# Patient Record
Sex: Male | Born: 1965 | Hispanic: Yes | Marital: Married | Smoking: Former smoker
Health system: Southern US, Community
[De-identification: ages and names within clinical notes are randomized; demographics above are authoritative.]

## PROBLEM LIST (undated history)

## (undated) DIAGNOSIS — I10 Essential (primary) hypertension: Secondary | ICD-10-CM

## (undated) DIAGNOSIS — E119 Type 2 diabetes mellitus without complications: Secondary | ICD-10-CM

---

## 2021-08-22 ENCOUNTER — Emergency Department: Payer: Self-pay

## 2021-08-22 ENCOUNTER — Other Ambulatory Visit: Payer: Self-pay

## 2021-08-22 ENCOUNTER — Inpatient Hospital Stay
Admission: EM | Admit: 2021-08-22 | Discharge: 2021-08-26 | DRG: 641 | Disposition: A | Discharge status: Home / Self Care | Payer: Self-pay | Attending: Internal Medicine | Admitting: Internal Medicine

## 2021-08-22 ENCOUNTER — Encounter: Payer: Self-pay | Admitting: Emergency Medicine

## 2021-08-22 DIAGNOSIS — M5126 Other intervertebral disc displacement, lumbar region: Secondary | ICD-10-CM | POA: Diagnosis present

## 2021-08-22 DIAGNOSIS — Z87891 Personal history of nicotine dependence: Secondary | ICD-10-CM

## 2021-08-22 DIAGNOSIS — M545 Low back pain, unspecified: Secondary | ICD-10-CM

## 2021-08-22 DIAGNOSIS — E119 Type 2 diabetes mellitus without complications: Secondary | ICD-10-CM

## 2021-08-22 DIAGNOSIS — E039 Hypothyroidism, unspecified: Secondary | ICD-10-CM

## 2021-08-22 DIAGNOSIS — K567 Ileus, unspecified: Secondary | ICD-10-CM | POA: Diagnosis present

## 2021-08-22 DIAGNOSIS — K5900 Constipation, unspecified: Secondary | ICD-10-CM

## 2021-08-22 DIAGNOSIS — R140 Abdominal distension (gaseous): Secondary | ICD-10-CM

## 2021-08-22 DIAGNOSIS — Z20822 Contact with and (suspected) exposure to covid-19: Secondary | ICD-10-CM | POA: Diagnosis present

## 2021-08-22 DIAGNOSIS — E871 Hypo-osmolality and hyponatremia: Principal | ICD-10-CM | POA: Diagnosis present

## 2021-08-22 DIAGNOSIS — Z794 Long term (current) use of insulin: Secondary | ICD-10-CM

## 2021-08-22 DIAGNOSIS — I10 Essential (primary) hypertension: Secondary | ICD-10-CM | POA: Diagnosis present

## 2021-08-22 DIAGNOSIS — Z6824 Body mass index (BMI) 24.0-24.9, adult: Secondary | ICD-10-CM

## 2021-08-22 DIAGNOSIS — R7989 Other specified abnormal findings of blood chemistry: Secondary | ICD-10-CM

## 2021-08-22 DIAGNOSIS — E861 Hypovolemia: Secondary | ICD-10-CM | POA: Diagnosis present

## 2021-08-22 DIAGNOSIS — Z79899 Other long term (current) drug therapy: Secondary | ICD-10-CM

## 2021-08-22 DIAGNOSIS — R630 Anorexia: Secondary | ICD-10-CM | POA: Diagnosis present

## 2021-08-22 DIAGNOSIS — E875 Hyperkalemia: Secondary | ICD-10-CM

## 2021-08-22 HISTORY — DX: Essential (primary) hypertension: I10

## 2021-08-22 HISTORY — DX: Type 2 diabetes mellitus without complications: E11.9

## 2021-08-22 LAB — URINALYSIS, ROUTINE W REFLEX MICROSCOPIC
Bacteria, UA: NONE SEEN
Bilirubin Urine: NEGATIVE
Glucose, UA: NEGATIVE mg/dL
Hgb urine dipstick: NEGATIVE
Ketones, ur: NEGATIVE mg/dL
Leukocytes,Ua: NEGATIVE
Nitrite: NEGATIVE
Protein, ur: 30 mg/dL — AB
Specific Gravity, Urine: 1.011 (ref 1.005–1.030)
Squamous Epithelial / HPF: NONE SEEN (ref 0–5)
pH: 7 (ref 5.0–8.0)

## 2021-08-22 LAB — COMPREHENSIVE METABOLIC PANEL
ALT: 19 U/L (ref 0–44)
AST: 18 U/L (ref 15–41)
Albumin: 2.8 g/dL — ABNORMAL LOW (ref 3.5–5.0)
Alkaline Phosphatase: 65 U/L (ref 38–126)
Anion gap: 4 — ABNORMAL LOW (ref 5–15)
BUN: 22 mg/dL — ABNORMAL HIGH (ref 6–20)
CO2: 27 mmol/L (ref 22–32)
Calcium: 8.9 mg/dL (ref 8.9–10.3)
Chloride: 91 mmol/L — ABNORMAL LOW (ref 98–111)
Creatinine, Ser: 0.81 mg/dL (ref 0.61–1.24)
GFR, Estimated: >60 mL/min (ref >60)
Glucose, Bld: 107 mg/dL — ABNORMAL HIGH (ref 70–99)
Potassium: 5.1 mmol/L (ref 3.5–5.1)
Sodium: 122 mmol/L — ABNORMAL LOW (ref 135–145)
Total Bilirubin: 0.7 mg/dL (ref 0.3–1.2)
Total Protein: 5.9 g/dL — ABNORMAL LOW (ref 6.5–8.1)

## 2021-08-22 LAB — HIV ANTIBODY (ROUTINE TESTING W REFLEX): HIV Screen 4th Generation wRfx: NONREACTIVE

## 2021-08-22 LAB — LIPASE, BLOOD: Lipase: 33 U/L (ref 11–51)

## 2021-08-22 LAB — CBC WITH DIFFERENTIAL/PLATELET
Abs Immature Granulocytes: 0.01 10*3/uL (ref 0.00–0.07)
Basophils Absolute: 0 10*3/uL (ref 0.0–0.1)
Basophils Relative: 0 %
Eosinophils Absolute: 0.1 10*3/uL (ref 0.0–0.5)
Eosinophils Relative: 1 %
HCT: 35.2 % — ABNORMAL LOW (ref 39.0–52.0)
Hemoglobin: 13 g/dL (ref 13.0–17.0)
Immature Granulocytes: 0 %
Lymphocytes Relative: 45 %
Lymphs Abs: 2.6 10*3/uL (ref 0.7–4.0)
MCH: 30.8 pg (ref 26.0–34.0)
MCHC: 36.9 g/dL — ABNORMAL HIGH (ref 30.0–36.0)
MCV: 83.4 fL (ref 80.0–100.0)
Monocytes Absolute: 0.5 10*3/uL (ref 0.1–1.0)
Monocytes Relative: 8 %
Neutro Abs: 2.6 10*3/uL (ref 1.7–7.7)
Neutrophils Relative %: 46 %
Platelets: 368 10*3/uL (ref 150–400)
RBC: 4.22 MIL/uL (ref 4.22–5.81)
RDW: 12.5 % (ref 11.5–15.5)
WBC: 5.8 10*3/uL (ref 4.0–10.5)
nRBC: 0 % (ref 0.0–0.2)

## 2021-08-22 LAB — RESP PANEL BY RT-PCR (FLU A&B, COVID) ARPGX2
Influenza A by PCR: NEGATIVE
Influenza B by PCR: NEGATIVE
SARS Coronavirus 2 by RT PCR: NEGATIVE

## 2021-08-22 LAB — BASIC METABOLIC PANEL
Anion gap: 6 (ref 5–15)
BUN: 21 mg/dL — ABNORMAL HIGH (ref 6–20)
CO2: 24 mmol/L (ref 22–32)
Calcium: 8.8 mg/dL — ABNORMAL LOW (ref 8.9–10.3)
Chloride: 93 mmol/L — ABNORMAL LOW (ref 98–111)
Creatinine, Ser: 0.91 mg/dL (ref 0.61–1.24)
GFR, Estimated: >60 mL/min (ref >60)
Glucose, Bld: 101 mg/dL — ABNORMAL HIGH (ref 70–99)
Potassium: 4.9 mmol/L (ref 3.5–5.1)
Sodium: 123 mmol/L — ABNORMAL LOW (ref 135–145)

## 2021-08-22 LAB — GLUCOSE, CAPILLARY
Glucose-Capillary: 150 mg/dL — ABNORMAL HIGH (ref 70–99)
Glucose-Capillary: 104 mg/dL — ABNORMAL HIGH (ref 70–99)

## 2021-08-22 LAB — OSMOLALITY, URINE: Osmolality, Ur: 179 mOsm/kg — ABNORMAL LOW (ref 300–900)

## 2021-08-22 LAB — HEPATIC FUNCTION PANEL
ALT: 21 U/L (ref 0–44)
AST: 18 U/L (ref 15–41)
Albumin: 2.6 g/dL — ABNORMAL LOW (ref 3.5–5.0)
Alkaline Phosphatase: 71 U/L (ref 38–126)
Bilirubin, Direct: <0.1 mg/dL (ref 0.0–0.2)
Total Bilirubin: 0.6 mg/dL (ref 0.3–1.2)
Total Protein: 5.5 g/dL — ABNORMAL LOW (ref 6.5–8.1)

## 2021-08-22 LAB — OSMOLALITY: Osmolality: 264 mOsm/kg — ABNORMAL LOW (ref 275–295)

## 2021-08-22 LAB — CBG MONITORING, ED: Glucose-Capillary: 166 mg/dL — ABNORMAL HIGH (ref 70–99)

## 2021-08-22 LAB — SODIUM: Sodium: 133 mmol/L — ABNORMAL LOW (ref 135–145)

## 2021-08-22 LAB — TROPONIN I (HIGH SENSITIVITY)
Troponin I (High Sensitivity): 10 ng/L (ref <18)
Troponin I (High Sensitivity): 10 ng/L (ref <18)

## 2021-08-22 LAB — HEMOGLOBIN A1C
Hgb A1c MFr Bld: 7.5 % — ABNORMAL HIGH (ref 4.8–5.6)
Mean Plasma Glucose: 168.55 mg/dL

## 2021-08-22 LAB — TSH: TSH: 9.042 u[IU]/mL — ABNORMAL HIGH (ref 0.350–4.500)

## 2021-08-22 IMAGING — CT CT ABD-PELV W/ CM
2 of 5 series · 15 of 46 positions shown, 17 images · IV contrast (APPLIED)
Comparison: Chest radiographs [E1] hours today.

CLINICAL DATA: 56-year-old male with abdominal distension.
Constipation. Abdominal pain for 2 weeks.

EXAM:
CT ABDOMEN AND PELVIS WITH CONTRAST
TECHNIQUE: Multidetector CT imaging of the abdomen and pelvis was performed
using the standard protocol following bolus administration of
intravenous contrast.

[Series 2: routine abd/pel with · axial · 0.75mm/px · z∈[-504,-84]mm · 12 of 94 slices shown, 14 images]
[im 5/94  soft-tissue]
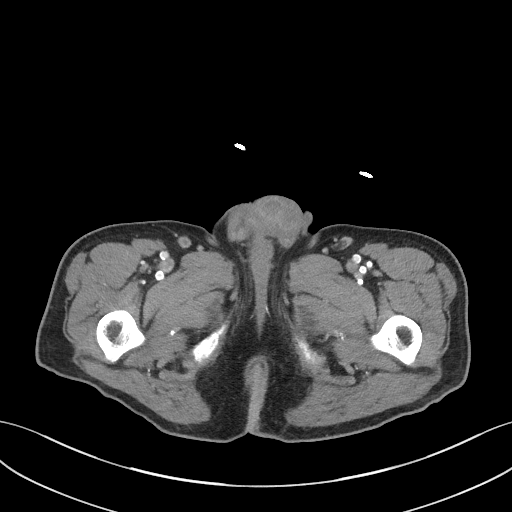
[im 5/94  bone]
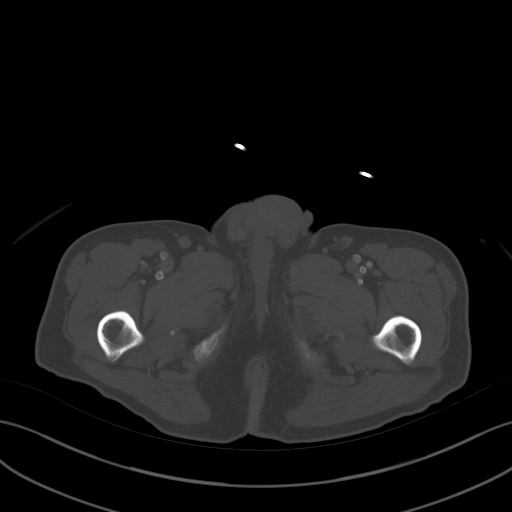
[im 15/94  soft-tissue]
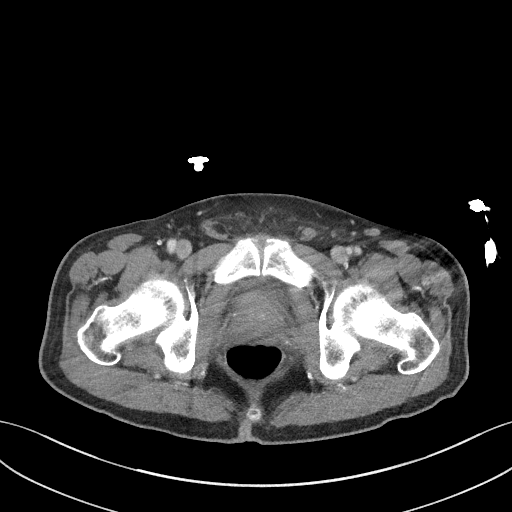
[im 20/94  soft-tissue]
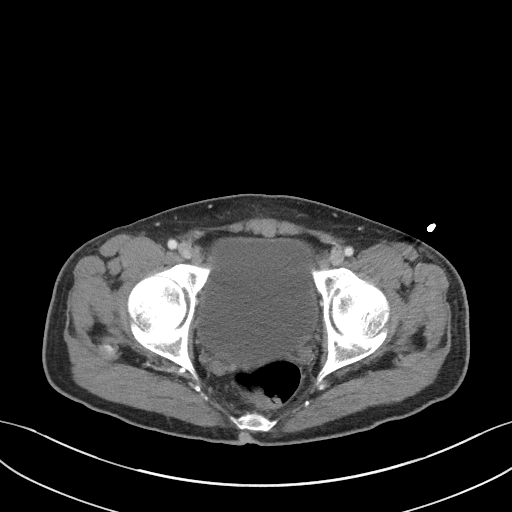
[im 30/94  soft-tissue]
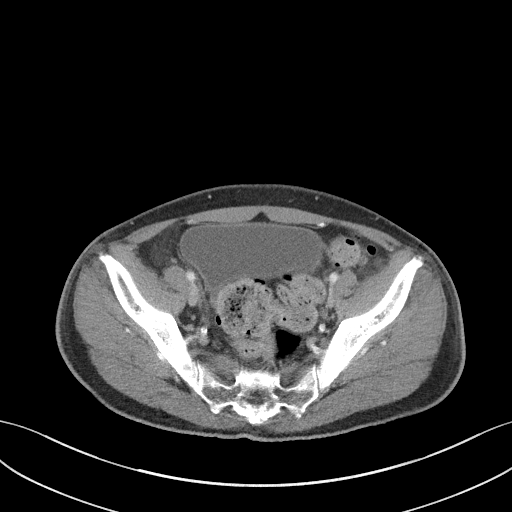
[im 35/94  soft-tissue]
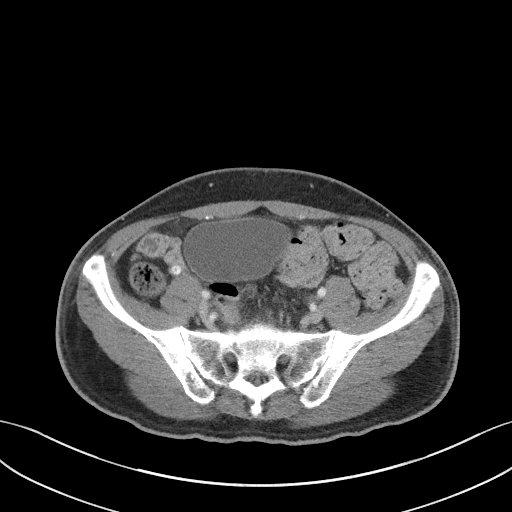
[im 45/94  soft-tissue]
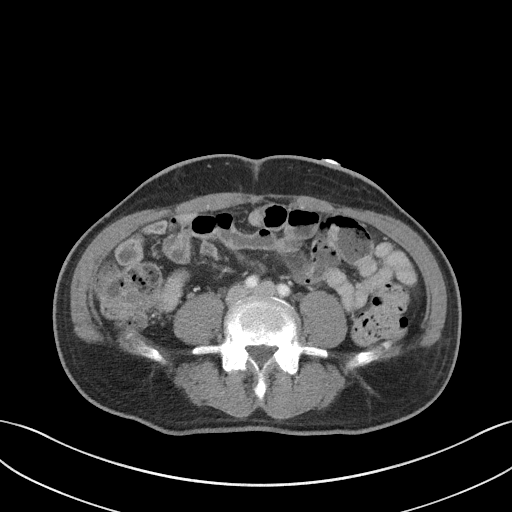
[im 49/94  soft-tissue]
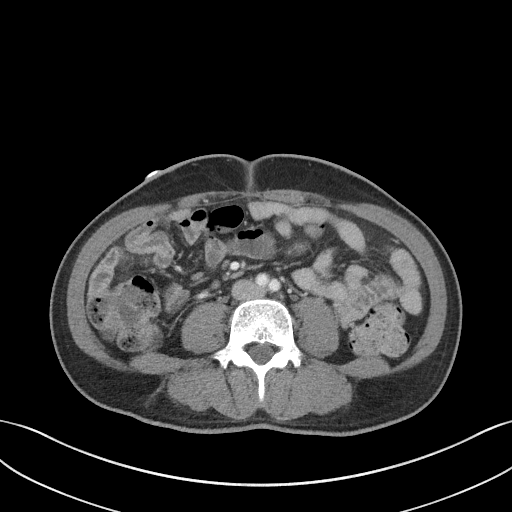
[im 59/94  soft-tissue]
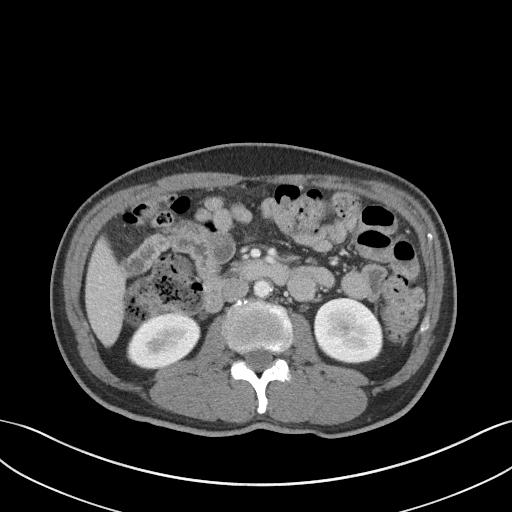
[im 64/94  soft-tissue]
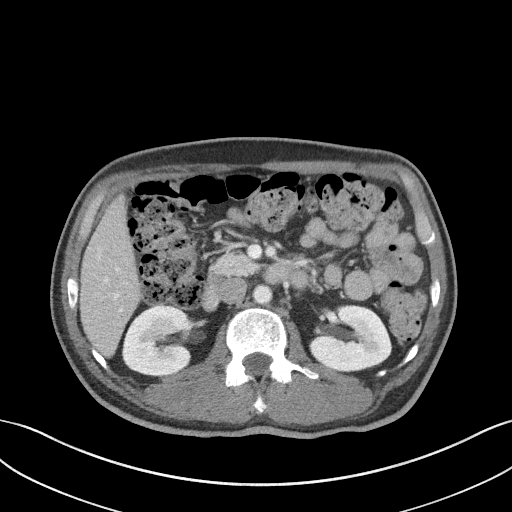
[im 64/94  bone]
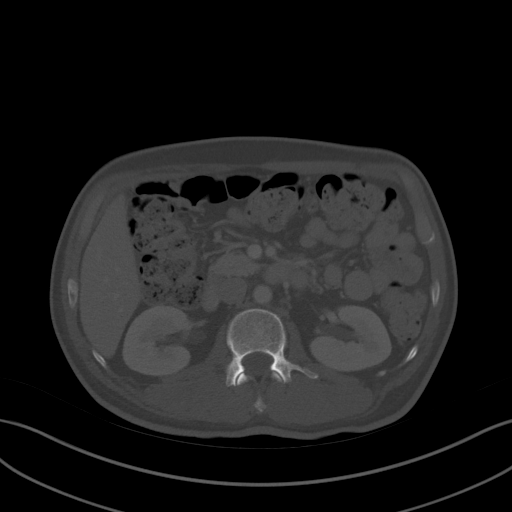
[im 74/94  soft-tissue]
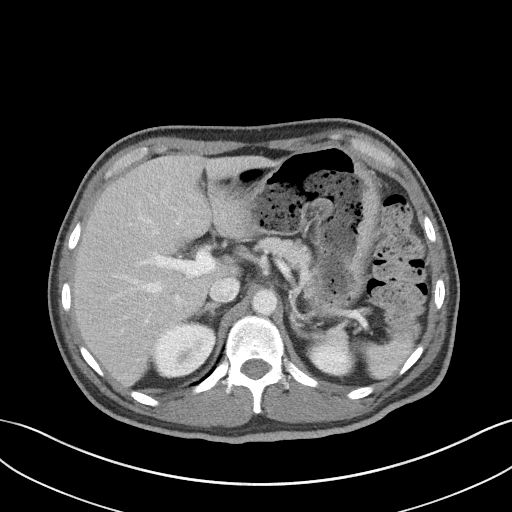
[im 79/94  soft-tissue]
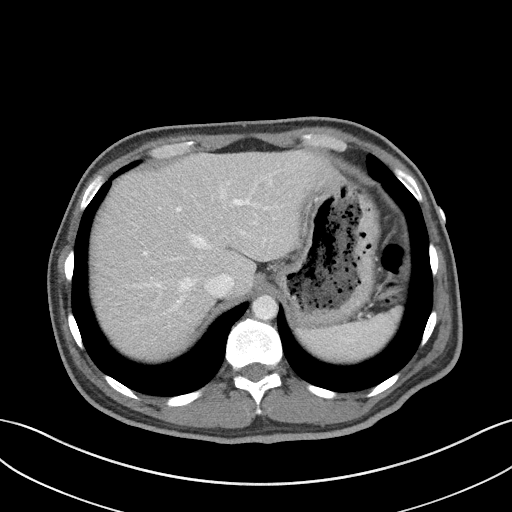
[im 89/94  soft-tissue]
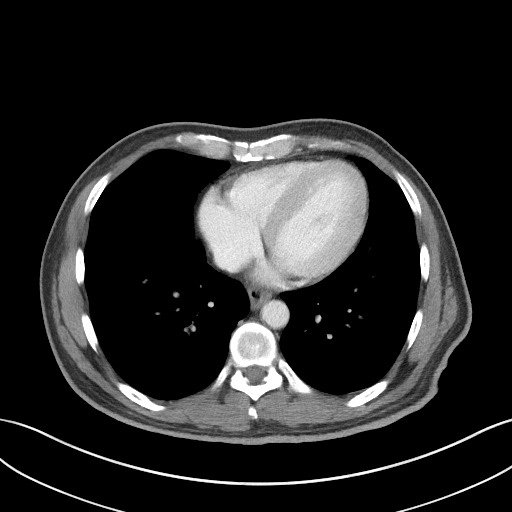

[Series 5: coronal st · coronal · 0.71mm/px · 3 of 85 slices shown]
[im 29/85  soft-tissue]
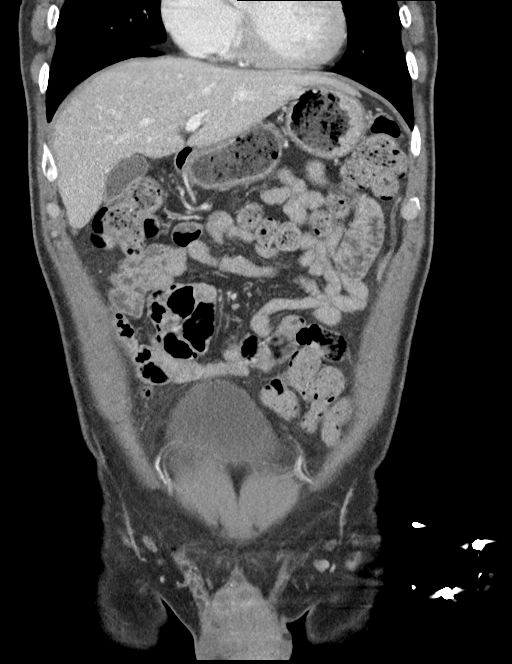
[im 38/85  soft-tissue]
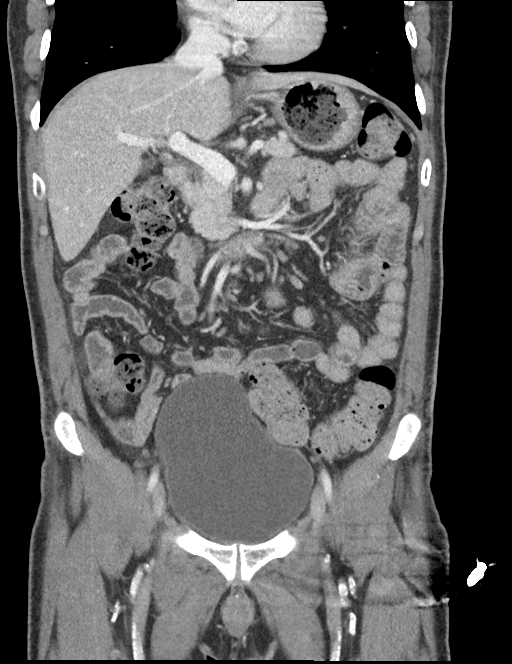
[im 47/85  soft-tissue]
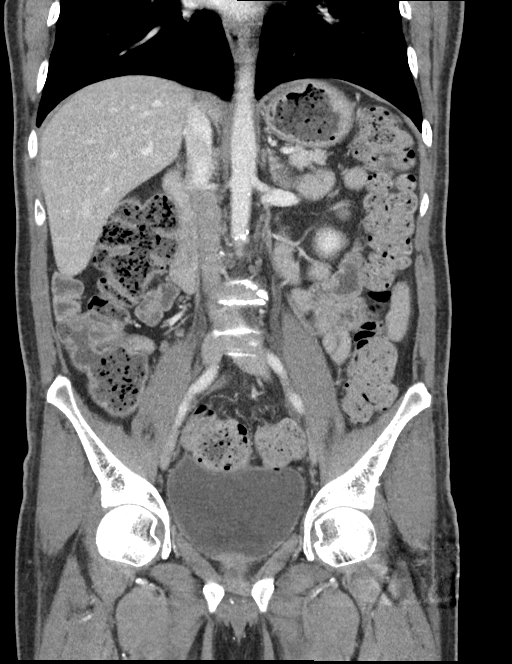

[15 of 46 positions shown; findings below may reference images not displayed]

RADIATION DOSE REDUCTION: This exam was performed according to the
departmental dose-optimization program which includes automated
exposure control, adjustment of the mA and/or kV according to
patient size and/or use of iterative reconstruction technique.

CONTRAST:  100mL OMNIPAQUE IOHEXOL 300 MG/ML  SOLN
FINDINGS: Lower chest: Negative.  No pericardial or pleural effusion.

Hepatobiliary: Negative liver and gallbladder.

Pancreas: Negative.

Spleen: Negative.

Adrenals/Urinary Tract: Normal adrenal glands.

Kidneys appear symmetric and normal. Normal renal enhancement and
contrast excretion. No nephrolithiasis or pararenal inflammation.
Distended but otherwise unremarkable bladder (sagittal image 52).

Stomach/Bowel: Negative rectum but retained stool throughout the
upstream large bowel which is mildly redundant. No large bowel wall
thickening or inflammation. Right lower quadrant appendix appears to
contain oral contrast and otherwise appears normal (coronal image
42). Negative terminal ileum. No dilated small bowel. Unremarkable
stomach and duodenum. No free air, free fluid, mesenteric
inflammation.

Vascular/Lymphatic: No lymphadenopathy identified. Aortoiliac
calcified atherosclerosis. And extensive visible femoral artery
calcified atherosclerosis. Major arterial structures remain patent.
Portal venous system is patent.

Reproductive: Negative.

Other: No pelvic free fluid. There are relatively large left pelvic
floor phleboliths.

Musculoskeletal: Negative. Small left femoral head benign bone
island.
IMPRESSION: 1. No acute or inflammatory process identified in the abdomen or
pelvis.
Retained stool throughout the large bowel (sparing the rectum) may
reflect constipation. Normal appendix.
Distended but otherwise unremarkable bladder, query urinary
retention.

2. Aortic Atherosclerosis ([E1]-[E1]).

## 2021-08-22 IMAGING — CR DG CHEST 2V
1 series · 2 of 2 positions shown · non-contrast
Comparison: None.

CLINICAL DATA: Upper abdominal pain.

EXAM:
CHEST - 2 VIEW

[Series 1: dg chest 2 view · 0.14mm/px · 2 of 2 slices shown]
[im 1/2]
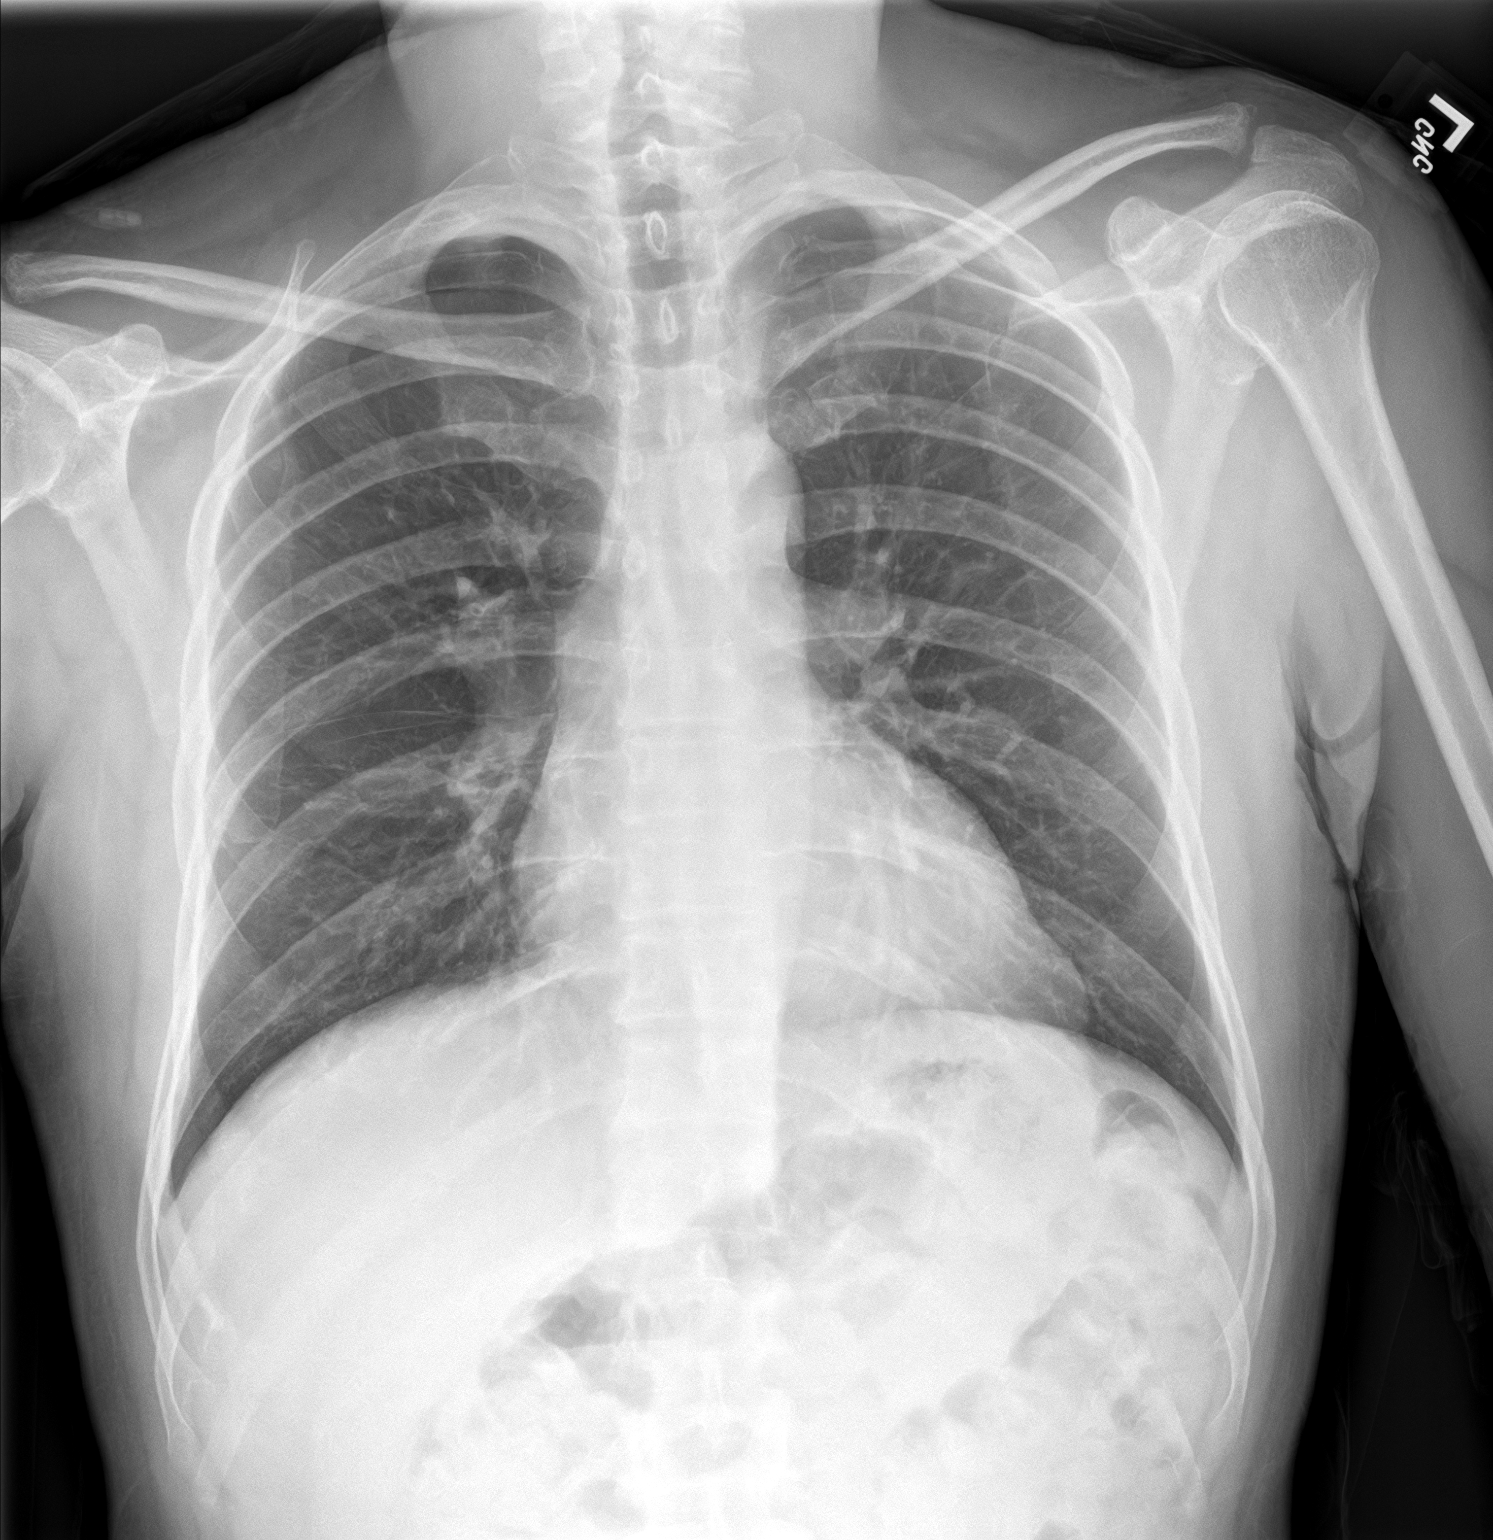
[im 2/2]
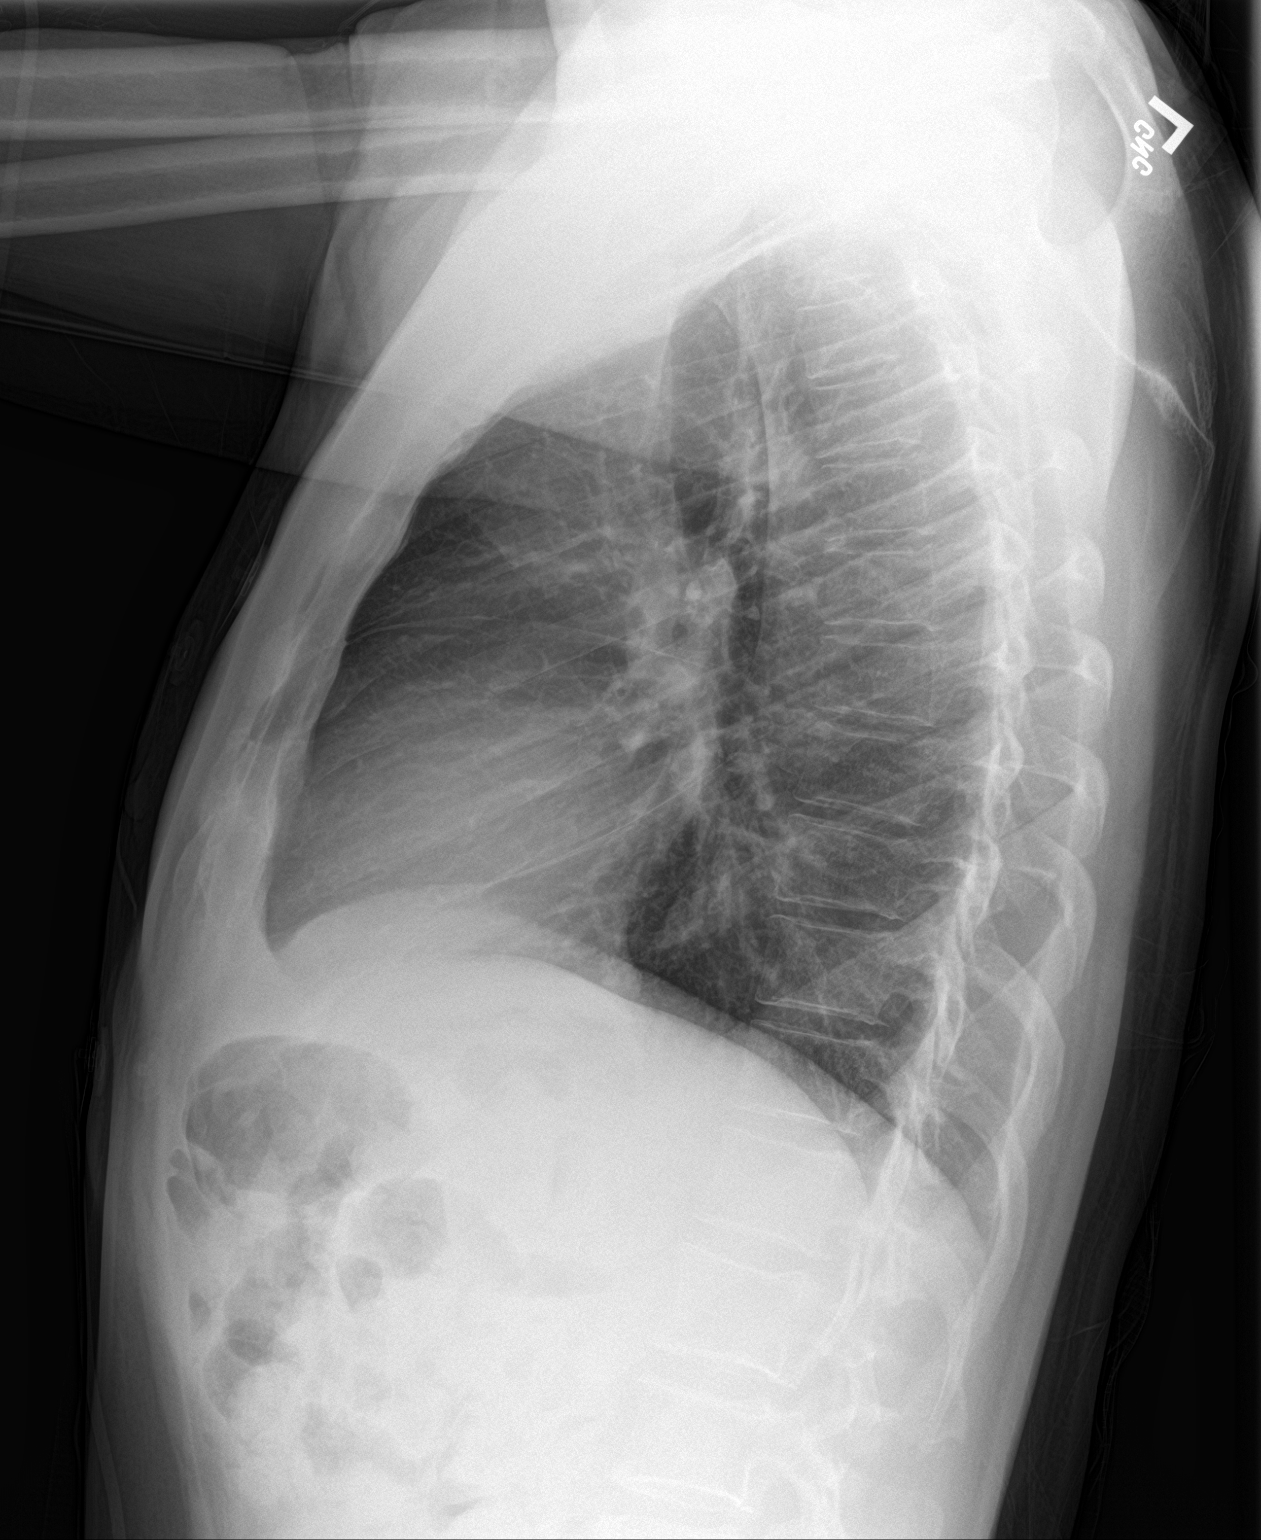

[2 of 2 positions shown; findings below may reference images not displayed]

FINDINGS: The heart size and mediastinal contours are within normal limits.
Both lungs are clear. The visualized skeletal structures are
unremarkable.
IMPRESSION: No active cardiopulmonary disease.

## 2021-08-22 MED ORDER — AMLODIPINE BESYLATE 10 MG PO TABS
10.0000 mg | ORAL_TABLET | Freq: Every day | ORAL | Status: DC
  Administered 2021-08-22 – 2021-08-26 (×5): 10 mg via ORAL
  Filled 2021-08-22: qty 2
  Filled 2021-08-22 (×4): qty 1

## 2021-08-22 MED ORDER — INSULIN GLARGINE-YFGN 100 UNIT/ML ~~LOC~~ SOLN
10.0000 [IU] | Freq: Two times a day (BID) | SUBCUTANEOUS | Status: DC
  Administered 2021-08-22 – 2021-08-26 (×9): 10 [IU] via SUBCUTANEOUS
  Filled 2021-08-22 (×10): qty 0.1

## 2021-08-22 MED ORDER — POLYETHYLENE GLYCOL 3350 17 G PO PACK
17.0000 g | PACK | Freq: Every day | ORAL | Status: DC
  Administered 2021-08-22 – 2021-08-25 (×4): 17 g via ORAL
  Filled 2021-08-22 (×4): qty 1

## 2021-08-22 MED ORDER — KETOROLAC TROMETHAMINE 0.5 % OP SOLN
1.0000 [drp] | Freq: Four times a day (QID) | OPHTHALMIC | Status: DC
  Administered 2021-08-22 – 2021-08-26 (×15): 1 [drp] via OPHTHALMIC
  Filled 2021-08-22 (×2): qty 3

## 2021-08-22 MED ORDER — SODIUM CHLORIDE 0.9 % IV SOLN: INTRAVENOUS | Status: DC

## 2021-08-22 MED ORDER — ONDANSETRON HCL 4 MG/2ML IJ SOLN: 4.0000 mg | Freq: Four times a day (QID) | INTRAMUSCULAR | Status: DC | PRN

## 2021-08-22 MED ORDER — ONDANSETRON HCL 4 MG PO TABS: 4.0000 mg | ORAL_TABLET | Freq: Four times a day (QID) | ORAL | Status: DC | PRN

## 2021-08-22 MED ORDER — TOLVAPTAN 15 MG PO TABS
15.0000 mg | ORAL_TABLET | Freq: Once | ORAL | Status: AC
  Administered 2021-08-22: 15 mg via ORAL
  Filled 2021-08-22: qty 1

## 2021-08-22 MED ORDER — SODIUM CHLORIDE 0.9 % IV BOLUS
1000.0000 mL | Freq: Once | INTRAVENOUS | Status: AC
  Administered 2021-08-22: 1000 mL via INTRAVENOUS

## 2021-08-22 MED ORDER — ENOXAPARIN SODIUM 40 MG/0.4ML IJ SOSY
40.0000 mg | PREFILLED_SYRINGE | INTRAMUSCULAR | Status: DC
  Administered 2021-08-22 – 2021-08-24 (×3): 40 mg via SUBCUTANEOUS
  Filled 2021-08-22 (×3): qty 0.4

## 2021-08-22 MED ORDER — PANTOPRAZOLE SODIUM 40 MG PO TBEC
40.0000 mg | DELAYED_RELEASE_TABLET | Freq: Every day | ORAL | Status: DC
  Administered 2021-08-22 – 2021-08-26 (×5): 40 mg via ORAL
  Filled 2021-08-22 (×5): qty 1

## 2021-08-22 MED ORDER — LISINOPRIL 20 MG PO TABS
20.0000 mg | ORAL_TABLET | Freq: Two times a day (BID) | ORAL | Status: DC
  Administered 2021-08-22 – 2021-08-25 (×8): 20 mg via ORAL
  Filled 2021-08-22 (×3): qty 1
  Filled 2021-08-22: qty 2
  Filled 2021-08-22 (×5): qty 1

## 2021-08-22 MED ORDER — INSULIN ASPART 100 UNIT/ML IJ SOLN
0.0000 [IU] | Freq: Three times a day (TID) | INTRAMUSCULAR | Status: DC
  Administered 2021-08-22: 2 [IU] via SUBCUTANEOUS
  Administered 2021-08-22 – 2021-08-23 (×2): 3 [IU] via SUBCUTANEOUS
  Administered 2021-08-23: 8 [IU] via SUBCUTANEOUS
  Administered 2021-08-24: 2 [IU] via SUBCUTANEOUS
  Administered 2021-08-24: 5 [IU] via SUBCUTANEOUS
  Administered 2021-08-24 – 2021-08-25 (×2): 2 [IU] via SUBCUTANEOUS
  Administered 2021-08-25 – 2021-08-26 (×2): 5 [IU] via SUBCUTANEOUS
  Filled 2021-08-22 (×10): qty 1

## 2021-08-22 MED ORDER — ACETAMINOPHEN 650 MG RE SUPP
650.0000 mg | Freq: Four times a day (QID) | RECTAL | Status: DC | PRN
Start: 2021-08-22 — End: 2021-08-26

## 2021-08-22 MED ORDER — BISACODYL 10 MG RE SUPP
10.0000 mg | Freq: Once | RECTAL | Status: DC
  Filled 2021-08-22: qty 1

## 2021-08-22 MED ORDER — ACETAMINOPHEN 325 MG PO TABS
650.0000 mg | ORAL_TABLET | Freq: Four times a day (QID) | ORAL | Status: DC | PRN
  Administered 2021-08-22 – 2021-08-26 (×6): 650 mg via ORAL
  Filled 2021-08-22 (×6): qty 2

## 2021-08-22 MED ORDER — PREDNISOLONE ACETATE 1 % OP SUSP
1.0000 [drp] | Freq: Three times a day (TID) | OPHTHALMIC | Status: DC
  Administered 2021-08-22 – 2021-08-26 (×12): 1 [drp] via OPHTHALMIC
  Filled 2021-08-22 (×2): qty 1

## 2021-08-22 MED ORDER — IOHEXOL 300 MG/ML  SOLN
100.0000 mL | Freq: Once | INTRAMUSCULAR | Status: AC | PRN
  Administered 2021-08-22: 100 mL via INTRAVENOUS

## 2021-08-22 MED ORDER — SODIUM CHLORIDE 0.9 % IV SOLN: Freq: Once | INTRAVENOUS | Status: AC

## 2021-08-22 NOTE — ED Notes (Signed)
Transport requested

## 2021-08-22 NOTE — Consult Note (Signed)
[facility]  CONSULT NOTE    Date: 08/22/2021                  Patient Name:  Alexander Kerr  MRN: [id number]  DOB: 1965-08-22  Age / Sex: 56 y.o., male         PCP: [facility], Evangeline                 Service Requesting Consult: [place]                 Reason for Consult: Hyponatremia            History of Present Illness: Alexander Kerr is a 56 y.o.  male with previous medical conditions including diabetes and hypertension, who was admitted to [facility] on 08/22/2021 for Hyponatremia [E87.1]  Patient presents to the emergency department with complaints of abdominal pain lasting for 2 weeks.  He does report nausea, constipation, anorexia.  States he normally has 2-3 BMs daily that has decreased over the past 2 weeks to every other day with formed to hard stools.  States poor appetite has increased but has maintained water and Gatorade to prevent dehydration.  Denies shortness of breath, fever, pain or discomfort.  States this pain has prevented him from working.  Unable to sit up for long periods of time, prefers laying down.  Labs on ED arrival include sodium 122.  Respiratory panel negative for flu and COVID-19.  UA with minimal protein.  Next x-ray shows no acute changes CT abdomen pelvis with contrast show some constipation.   Medications: Outpatient medications: Medications Prior to Admission  Medication Sig Dispense Refill Last Dose   amLODipine (NORVASC) 10 MG tablet Take 10 mg by mouth daily.   08/21/2021 at 0900   ketorolac (ACULAR) 0.5 % ophthalmic solution SMARTSIG:In Eye(s)   08/21/2021 at 1700   LANTUS SOLOSTAR 100 UNIT/ML Solostar Pen Inject 10 Units into the skin 2 (two) times daily.   08/21/2021 at 1200   lisinopril (ZESTRIL) 20 MG tablet Take 20 mg by mouth 2 (two) times daily.   08/21/2021 at 0900   omeprazole (PRILOSEC) 20 MG capsule Take 20 mg by mouth 2 (two) times daily.   08/21/2021 at 0900   prednisoLONE acetate (PRED  FORTE) 1 % ophthalmic suspension 1 drop in the morning, at noon, and at bedtime.   08/21/2021 at 1900   VOLTAREN 1 % GEL Apply 1 application topically 4 (four) times daily as needed.   08/21/2021 at 2200    Current medications: Current Facility-Administered Medications  Medication Dose Route Frequency Provider Last Rate Last Admin   0.9 %  sodium chloride infusion   Intravenous Continuous Agbata, Tochukwu, MD 100 mL/hr at 08/22/21 0943 Rate Change at 08/22/21 0943   acetaminophen (TYLENOL) tablet 650 mg  650 mg Oral Q6H PRN Agbata, Tochukwu, MD       Or   acetaminophen (TYLENOL) suppository 650 mg  650 mg Rectal Q6H PRN Agbata, Tochukwu, MD       amLODipine (NORVASC) tablet 10 mg  10 mg Oral Daily Agbata, Tochukwu, MD   10 mg at 08/22/21 1010   enoxaparin (LOVENOX) injection 40 mg  40 mg Subcutaneous Q24H Agbata, Tochukwu, MD   40 mg at 08/22/21 1012   insulin aspart (novoLOG) injection 0-15 Units  0-15 Units Subcutaneous TID WC Agbata, Tochukwu, MD   3 Units at 08/22/21 1232   insulin glargine-yfgn (SEMGLEE) injection 10 Units  10 Units  Subcutaneous BID Agbata, Tochukwu, MD   10 Units at 08/22/21 1129   ketorolac (ACULAR) 0.5 % ophthalmic solution 1 drop  1 drop Both Eyes QID Agbata, Tochukwu, MD       lisinopril (ZESTRIL) tablet 20 mg  20 mg Oral BID Agbata, Tochukwu, MD   20 mg at 08/22/21 1010   ondansetron (ZOFRAN) tablet 4 mg  4 mg Oral Q6H PRN Agbata, Tochukwu, MD       Or   ondansetron (ZOFRAN) injection 4 mg  4 mg Intravenous Q6H PRN Agbata, Tochukwu, MD       pantoprazole (PROTONIX) EC tablet 40 mg  40 mg Oral Daily Agbata, Tochukwu, MD   40 mg at 08/22/21 1011   polyethylene glycol (MIRALAX / GLYCOLAX) packet 17 g  17 g Oral Daily Agbata, Tochukwu, MD   17 g at 08/22/21 1129   prednisoLONE acetate (PRED FORTE) 1 % ophthalmic suspension 1 drop  1 drop Both Eyes TID Agbata, Tochukwu, MD          Allergies: No Known Allergies    Past Medical History: Past Medical History:   Diagnosis Date   Diabetes mellitus without complication ([place])    Hypertension      Past Surgical History: History reviewed. No pertinent surgical history.   Family History: History reviewed. No pertinent family history.   Social History: Social History   Socioeconomic History   Marital status: Married    Spouse name: Not on file   Number of children: Not on file   Years of education: Not on file   Highest education level: Not on file  Occupational History   Not on file  Tobacco Use   Smoking status: Former    Types: Cigarettes   Smokeless tobacco: Never  Vaping Use   Vaping Use: Never used  Substance and Sexual Activity   Alcohol use: Not Currently   Drug use: Never   Sexual activity: Not on file  Other Topics Concern   Not on file  Social History Narrative   Not on file   Social Determinants of Health   Financial Resource Strain: Not on file  Food Insecurity: Not on file  Transportation Needs: Not on file  Physical Activity: Not on file  Stress: Not on file  Social Connections: Not on file  Intimate Partner Violence: Not on file     Review of Systems: Review of Systems  Constitutional:  Negative for chills, fever and malaise/fatigue.  HENT:  Negative for congestion, sore throat and tinnitus.   Eyes:  Negative for blurred vision and redness.  Respiratory:  Negative for cough, shortness of breath and wheezing.   Cardiovascular:  Negative for chest pain, palpitations, claudication and leg swelling.  Gastrointestinal:  Positive for abdominal pain, constipation and nausea. Negative for blood in stool, diarrhea and vomiting.  Genitourinary:  Negative for flank pain, frequency and hematuria.  Musculoskeletal:  Negative for back pain, falls and myalgias.  Skin:  Negative for rash.  Neurological:  Negative for dizziness, weakness and headaches.  Endo/Heme/Allergies:  Does not bruise/bleed easily.  Psychiatric/Behavioral:  Negative for depression. The patient  is not nervous/anxious and does not have insomnia.    Vital Signs: Blood pressure (!) 159/86, pulse 89, temperature 98.3 F (36.8 C), temperature source Oral, resp. rate 20, height 5\' 2"  (1.575 m), weight 61.2 kg, SpO2 100 %.  Weight trends: Filed Weights   08/22/21 0430  Weight: 61.2 kg    Physical Exam: General: NAD, resting on stretcher  Head: Normocephalic, atraumatic. Moist oral mucosal membranes  Eyes: Anicteric  Lungs:  Clear to auscultation, normal effort  Heart: Regular rate and rhythm  Abdomen:  Soft, tender  Extremities: Trace peripheral edema.  Neurologic: Nonfocal, moving all four extremities  Skin: No lesions        Lab results: Basic Metabolic Panel: Recent Labs  Lab 08/22/21 0441 08/22/21 0600  NA 122* 123*  K 5.1 4.9  CL 91* 93*  CO2 27 24  GLUCOSE 107* 101*  BUN 22* 21*  CREATININE 0.81 0.91  CALCIUM 8.9 8.8*    Liver Function Tests: Recent Labs  Lab 08/22/21 0441  AST 18  ALT 19  ALKPHOS 65  BILITOT 0.7  PROT 5.9*  ALBUMIN 2.8*   Recent Labs  Lab 08/22/21 0441  LIPASE 33   No results for input(s): AMMONIA in the last 168 hours.  CBC: Recent Labs  Lab 08/22/21 0441  WBC 5.8  NEUTROABS 2.6  HGB 13.0  HCT 35.2*  MCV 83.4  PLT 368    Cardiac Enzymes: No results for input(s): CKTOTAL, CKMB, CKMBINDEX, TROPONINI in the last 168 hours.  BNP: Invalid input(s): POCBNP  CBG: Recent Labs  Lab 08/22/21 [place]*    Microbiology: Results for orders placed or performed during the hospital encounter of 08/22/21  Resp Panel by RT-PCR (Flu A&B, Covid) Nasopharyngeal Swab     Status: None   Collection Time: 08/22/21  6:00 AM   Specimen: Nasopharyngeal Swab; Nasopharyngeal(NP) swabs in vial transport medium  Result Value Ref Range Status   SARS Coronavirus 2 by RT PCR NEGATIVE NEGATIVE Final    Comment: (NOTE) SARS-CoV-2 target nucleic acids are NOT DETECTED.  The SARS-CoV-2 RNA is generally detectable in upper  respiratory specimens during the acute phase of infection. The lowest concentration of SARS-CoV-2 viral copies this assay can detect is 138 copies/mL. A negative result does not preclude SARS-Cov-2 infection and should not be used as the sole basis for treatment or other patient management decisions. A negative result may occur with  improper specimen collection/handling, submission of specimen other than nasopharyngeal swab, presence of viral mutation(s) within the areas targeted by this assay, and inadequate number of viral copies(<138 copies/mL). A negative result must be combined with clinical observations, patient history, and epidemiological information. The expected result is Negative.  Fact Sheet for Patients:  EntrepreneurPulse.com.au  Fact Sheet for Healthcare Providers:  IncredibleEmployment.be  This test is no t yet approved or cleared by the [place] FDA and  has been authorized for detection and/or diagnosis of SARS-CoV-2 by FDA under an Emergency Use Authorization (EUA). This EUA will remain  in effect (meaning this test can be used) for the duration of the COVID-19 declaration under Section 564(b)(1) of the Act, 21 U.S.C.section 360bbb-3(b)(1), unless the authorization is terminated  or revoked sooner.       Influenza A by PCR NEGATIVE NEGATIVE Final   Influenza B by PCR NEGATIVE NEGATIVE Final    Comment: (NOTE) The Xpert Xpress SARS-CoV-2/FLU/RSV plus assay is intended as an aid in the diagnosis of influenza from Nasopharyngeal swab specimens and should not be used as a sole basis for treatment. Nasal washings and aspirates are unacceptable for Xpert Xpress SARS-CoV-2/FLU/RSV testing.  Fact Sheet for Patients: EntrepreneurPulse.com.au  Fact Sheet for Healthcare Providers: IncredibleEmployment.be  This test is not yet approved or cleared by the [place] FDA and has been  authorized for detection and/or diagnosis of SARS-CoV-2 by FDA under an Emergency Use  Authorization (EUA). This EUA will remain in effect (meaning this test can be used) for the duration of the COVID-19 declaration under Section 564(b)(1) of the Act, 21 U.S.C. section 360bbb-3(b)(1), unless the authorization is terminated or revoked.  Performed at [facility], [address] [place], [place] [address]     Coagulation Studies: No results for input(s): LABPROT, INR in the last 72 hours.  Urinalysis: Recent Labs    08/22/21 0741  COLORURINE COLORLESS*  LABSPEC 1.011  PHURINE 7.0  GLUCOSEU NEGATIVE  HGBUR NEGATIVE  BILIRUBINUR NEGATIVE  KETONESUR NEGATIVE  PROTEINUR 30*  NITRITE NEGATIVE  LEUKOCYTESUR NEGATIVE      Imaging: DG Chest 2 View  Result Date: 08/22/2021 CLINICAL DATA:  Upper abdominal pain. EXAM: CHEST - 2 VIEW COMPARISON:  None. FINDINGS: The heart size and mediastinal contours are within normal limits. Both lungs are clear. The visualized skeletal structures are unremarkable. IMPRESSION: No active cardiopulmonary disease. Electronically Signed   By: Marijo Conception M.D.   On: 08/22/2021 06:44   CT ABDOMEN PELVIS W CONTRAST  Result Date: 08/22/2021 CLINICAL DATA:  56 year old male with abdominal distension. Constipation. Abdominal pain for 2 weeks. EXAM: CT ABDOMEN AND PELVIS WITH CONTRAST TECHNIQUE: Multidetector CT imaging of the abdomen and pelvis was performed using the standard protocol following bolus administration of intravenous contrast. RADIATION DOSE REDUCTION: This exam was performed according to the departmental dose-optimization program which includes automated exposure control, adjustment of the mA and/or kV according to patient size and/or use of iterative reconstruction technique. CONTRAST:  186mL OMNIPAQUE IOHEXOL 300 MG/ML  SOLN COMPARISON:  Chest radiographs 0629 hours today. FINDINGS: Lower chest: Negative.  No pericardial or pleural  effusion. Hepatobiliary: Negative liver and gallbladder. Pancreas: Negative. Spleen: Negative. Adrenals/Urinary Tract: Normal adrenal glands. Kidneys appear symmetric and normal. Normal renal enhancement and contrast excretion. No nephrolithiasis or pararenal inflammation. Distended but otherwise unremarkable bladder (sagittal image 52). Stomach/Bowel: Negative rectum but retained stool throughout the upstream large bowel which is mildly redundant. No large bowel wall thickening or inflammation. Right lower quadrant appendix appears to contain oral contrast and otherwise appears normal (coronal image 42). Negative terminal ileum. No dilated small bowel. Unremarkable stomach and duodenum. No free air, free fluid, mesenteric inflammation. Vascular/Lymphatic: No lymphadenopathy identified. Aortoiliac calcified atherosclerosis. And extensive visible femoral artery calcified atherosclerosis. Major arterial structures remain patent. Portal venous system is patent. Reproductive: Negative. Other: No pelvic free fluid. There are relatively large left pelvic floor phleboliths. Musculoskeletal: Negative. Small left femoral head benign bone island. IMPRESSION: 1. No acute or inflammatory process identified in the abdomen or pelvis. Retained stool throughout the large bowel (sparing the rectum) may reflect constipation. Normal appendix. Distended but otherwise unremarkable bladder, query urinary retention. 2. Aortic Atherosclerosis (ICD10-I70.0). Electronically Signed   By: Genevie Ann M.D.   On: 08/22/2021 07:11     Assessment & Plan: Mr. Sriansh Lizardi is a 56 y.o.  male with previous medical conditions including diabetes and hypertension, who was admitted to [facility] on 08/22/2021 for Hyponatremia [E87.1]  Hyponatremia likely due to excessive fluid intake.  Euvolemic.  Sodium on arrival 122.  Will provide low-dose tolvaptan 15 mg p.o. once and monitor sodium recovery.  Strict I's and O's and frequent sodium level checks  per protocol.   2.  Diabetes mellitus.  Hemoglobin A1c 7.5 on 08/22/2021.  Insulin-dependent.  Home regimen includes Lantus.  3.  Hypertension.  Home regimen includes amlodipine and lisinopril.  These medications continued during admission  LOS: 0 Ronetta Molla 2/17/20233:11 PM

## 2021-08-22 NOTE — ED Provider Notes (Signed)
[facility] Provider Note    Event Date/Time   First MD Initiated Contact with Patient 08/22/21 [phone]     (approximate)   History   Abdominal Pain   HPI  Alexander Kerr is a 56 y.o. male who presents to the ED for evaluation of Abdominal Pain   No hx in our system. Pt reports a hx of HTN on lisinopril and amlodipine, hx DM on insulin. Recently provided an empiric PPI due to subacute abd pain.   Pt presents to the ED , accompanied by his son, for evaluation of progressively worsening subacute generalized abdominal discomfort, constipation and tingling to his hands, feet and legs.  He reports 2 weeks of feeling bloated in his abdomen with generalized pain.  Denies emesis, diarrhea.  Reports taking omeprazole, prescribed by another facility, without improvement of his symptoms.   He reports feeling somewhat swollen to his bilateral lower extremities, but has no shortness of breath or orthopnea.  Denies chest pain, fever, syncope, falls or injuries.  Denies dysuria, hematuria.  Video Spanish interpreter utilized for history and physical.  Physical Exam   Triage Vital Signs: ED Triage Vitals  Enc Vitals Group     BP 08/22/21 0429 (!) 166/76     Pulse Rate 08/22/21 0429 91     Resp 08/22/21 0429 18     Temp 08/22/21 0429 98.6 F (37 C)     Temp Source 08/22/21 0429 Oral     SpO2 08/22/21 0429 98 %     Weight 08/22/21 0430 135 lb (61.2 kg)     Height 08/22/21 0430 5\' 2"  (1.575 m)     Head Circumference --      Peak Flow --      Pain Score 08/22/21 0430 7     Pain Loc --      Pain Edu? --      Excl. in [place]? --     Most recent vital signs: Vitals:   08/22/21 0429 08/22/21 0619  BP: (!) 166/76 (!) 172/90  Pulse: 91 80  Resp: 18 18  Temp: 98.6 F (37 C)   SpO2: 98% 100%    General: Awake, no distress.  Pleasant and conversational via interpreter. CV:  Good peripheral perfusion. RRR Resp:  Normal effort. CTAB Abd:  No distention.  Mild  and poorly localizing tenderness throughout the abdomen without guarding or peritoneal features. MSK:  No deformity noted.  Trace pitting edema to bilateral lower extremities symmetrically. Neuro:  No focal deficits appreciated. Cranial nerves II through XII intact 5/5 strength and sensation in all 4 extremities Other:     ED Results / Procedures / Treatments   Labs (all labs ordered are listed, but only abnormal results are displayed) Labs Reviewed  CBC WITH DIFFERENTIAL/PLATELET - Abnormal; Notable for the following components:      Result Value   HCT 35.2 (*)    MCHC 36.9 (*)    All other components within normal limits  COMPREHENSIVE METABOLIC PANEL - Abnormal; Notable for the following components:   Sodium 122 (*)    Chloride 91 (*)    Glucose, Bld 107 (*)    BUN 22 (*)    Total Protein 5.9 (*)    Albumin 2.8 (*)    Anion gap 4 (*)    All other components within normal limits  BASIC METABOLIC PANEL - Abnormal; Notable for the following components:   Sodium 123 (*)    Chloride 93 (*)  Glucose, Bld 101 (*)    BUN 21 (*)    Calcium 8.8 (*)    All other components within normal limits  RESP PANEL BY RT-PCR (FLU A&B, COVID) ARPGX2  LIPASE, BLOOD  URINALYSIS, ROUTINE W REFLEX MICROSCOPIC  TROPONIN I (HIGH SENSITIVITY)  TROPONIN I (HIGH SENSITIVITY)    EKG Sinus rhythm, rate of 88 bpm.  Normal axis and intervals.  No evidence of acute ischemia.  RADIOLOGY CXR reviewed by me without evidence of acute cardiopulmonary pathology.  Official radiology report(s): DG Chest 2 View  Result Date: 08/22/2021 CLINICAL DATA:  Upper abdominal pain. EXAM: CHEST - 2 VIEW COMPARISON:  None. FINDINGS: The heart size and mediastinal contours are within normal limits. Both lungs are clear. The visualized skeletal structures are unremarkable. IMPRESSION: No active cardiopulmonary disease. Electronically Signed   By: Marijo Conception M.D.   On: 08/22/2021 06:44    PROCEDURES and  INTERVENTIONS:  .1-3 Lead EKG Interpretation Performed by: Vladimir Crofts, MD Authorized by: Vladimir Crofts, MD     Interpretation: normal     ECG rate:  80   ECG rate assessment: normal     Rhythm: sinus rhythm     Ectopy: none     Conduction: normal    Medications  sodium chloride 0.9 % bolus 1,000 mL (1,000 mLs Intravenous New Bag/Given 08/22/21 0619)  iohexol (OMNIPAQUE) 300 MG/ML solution 100 mL (100 mLs Intravenous Contrast Given 08/22/21 0645)     IMPRESSION / MDM / ASSESSMENT AND PLAN / ED COURSE  I reviewed the triage vital signs and the nursing notes.  56 year old male presents to the ED with a couple weeks of generalized abdominal discomfort and constipation, found to have hyponatremia and require medical admission.  Looks clinically well but has some diffuse and poorly localizing abdominal tenderness without peritoneal features or guarding.  Blood work with hyponatremia of uncertain etiology.  He denies any regular ethanol intake or fluid losses.  Reports feeling constipated and bloated for couple weeks now.  Has some mild pitting edema peripherally without evidence of CHF or orthopnea.  CBC is unremarkable as is his lipase.  Negative troponin and nonischemic EKG.  Awaiting CT abdomen/pelvis to assess for any obstructive pathology.  Started IV normal saline 2 initiate correction of his sodium.  He will require admission to the hospital due to the severity of his hyponatremia.  Signed out to oncoming provider to follow-up on the CT before admission.      FINAL CLINICAL IMPRESSION(S) / ED DIAGNOSES   Final diagnoses:  Hyponatremia     Rx / DC Orders   ED Discharge Orders     None        Note:  This document was prepared using Dragon voice recognition software and may include unintentional dictation errors.   Vladimir Crofts, MD 08/22/21 [phone]

## 2021-08-22 NOTE — ED Notes (Signed)
Up to b/r, steady gait. VSS.

## 2021-08-22 NOTE — ED Notes (Signed)
Alert, NAD, calm, interactive, c/o pain and nausea. VSS, BP high. Son at [facility]

## 2021-08-22 NOTE — ED Triage Notes (Signed)
Patient ambulatory to triage with steady gait, without difficulty or distress noted; pt reports generalized abd pain x 2wks with no accomp symptoms; rx omeprazole without relief

## 2021-08-22 NOTE — ED Notes (Signed)
RN aware bed assigned ?

## 2021-08-22 NOTE — ED Provider Notes (Signed)
----------------------------------------- °  8:25 AM on 08/22/2021 ----------------------------------------- Patient's repeat chemistry confirms hyponatremia sodium of 122/123 with a normal glucose.  Patient's low sodium is probably the cause of the patient's fatigue and paresthesia symptoms in addition patient CT shows constipation but no other acute abnormality.  Given the patient's hyponatremia we will admit to the hospitalist service, placed on normal saline infusion.  Patient is already received a 1 L normal saline bolus and sodium improved from 122-123.  No alcohol use.  Patient agreeable to plan.   Minna Antis, MD 08/22/21 [id number]

## 2021-08-22 NOTE — ED Notes (Signed)
Admitting MD at BS.  

## 2021-08-22 NOTE — H&P (Addendum)
History and Physical    Patient: Alexander Kerr QZE:[id number] DOB: 02-Dec-1965 DOA: 08/22/2021 DOS: the patient was seen and examined on 08/22/2021 PCP: [facility]  Patient coming from: Home  Chief Complaint:  Chief Complaint  Patient presents with   Abdominal Pain   Video Spanish interpreter utilized for H&P HPI: Alexander Kerr is a 56 y.o. male with medical history significant for diabetes mellitus and hypertension who presents to the emergency room for evaluation of a 2-week history of abdominal pain. Abdominal pain is mostly in the periumbilical area with radiation to both lower quadrants and is rated a 7 x 10 in intensity at its worst.  It is associated with nausea, constipation and anorexia.  Patient states that he has had chills but denies having any fever. Prior to this his normal bowel habits was about 2-3 stools a day but over the last 2 weeks he has a bowel movement probably every other day and states that his stools are hard and he does not feel he has voided completely. He complains of paresthesias in his hands and feet. He denies having any emesis, no fever, no chest pain, no dizziness, no lightheadedness, no headache, no urinary symptoms, no blurred vision or focal deficit.  Review of Systems: As mentioned in the history of present illness. All other systems reviewed and are negative. Past Medical History:  Diagnosis Date   Diabetes mellitus without complication (HCC)    Hypertension    History reviewed. No pertinent surgical history. Social History:  reports that he has quit smoking. His smoking use included cigarettes. He has never used smokeless tobacco. He reports that he does not currently use alcohol. He reports that he does not use drugs.  No Known Allergies  History reviewed. No pertinent family history.  Prior to Admission medications   Medication Sig Start Date End Date Taking? Authorizing Provider  amLODipine (NORVASC) 10  MG tablet Take 10 mg by mouth daily. 08/14/21  Yes [provider]  ketorolac (ACULAR) 0.5 % ophthalmic solution SMARTSIG:In Eye(s) 08/06/21  Yes [provider]  LANTUS SOLOSTAR 100 UNIT/ML Solostar Pen Inject 10 Units into the skin 2 (two) times daily. 06/25/21  Yes [provider]  lisinopril (ZESTRIL) 20 MG tablet Take 20 mg by mouth 2 (two) times daily. 06/25/21  Yes [provider]  omeprazole (PRILOSEC) 20 MG capsule Take 20 mg by mouth 2 (two) times daily. 08/14/21  Yes [provider]  prednisoLONE acetate (PRED FORTE) 1 % ophthalmic suspension 1 drop in the morning, at noon, and at bedtime. 08/11/21  Yes [provider]  VOLTAREN 1 % GEL Apply 1 application topically 4 (four) times daily as needed. 08/14/21  Yes [provider]    Physical Exam: Vitals:   08/22/21 0735 08/22/21 0800 08/22/21 0815 08/22/21 0830  BP: (!) 190/88 (!) 185/90 (!) 174/86 (!) 186/87  Pulse: 85 88 87 88  Resp: 13 14 (!) 9 19  Temp:      TempSrc:      SpO2: 100% 99% 100% 100%  Weight:      Height:       Physical Exam Constitutional:      Appearance: He is well-developed and normal weight.  HENT:     Head: Normocephalic and atraumatic.     Mouth/Throat:     Comments: Dry Cardiovascular:     Rate and Rhythm: Normal rate and regular rhythm.  Pulmonary:     Effort: Pulmonary effort is normal.  Breath sounds: Normal breath sounds.  Abdominal:     General: Abdomen is flat. Bowel sounds are normal.     Tenderness: There is abdominal tenderness in the right lower quadrant, periumbilical area and left lower quadrant.  Skin:    General: Skin is warm and dry.  Neurological:     General: No focal deficit present.     Mental Status: He is alert.     Data Reviewed: Notes from primary care and specialist visits, past discharge summaries. Prior diagnostic testing as applicable to current admission diagnoses Updated medications and problem lists  for reconciliation ED course, including vitals, labs, imaging, treatment and response to treatment Triage notes and ED providers notes Electrolytes show sodium level of 123, chloride 93, BUN 21, serum creatinine 0.91.  The rest of his labs are within normal limits Chest x-ray reviewed by me shows clear lungs CT scan of abdomen and pelvis shows retained stool throughout the large bowel.  Normal appendix.  Distended but otherwise unremarkable bladder. There are no new results to review at this time.  Assessment and Plan: Principal Problem:   Hyponatremia Active Problems:   Diabetes mellitus without complication (HCC)   Hypertension    Hyponatremia Most likely secondary to volume depletion from poor oral intake Place patient on normal saline Obtain urine and serum osmolality as well as urine sodium levels. We will consult nephrology     Diabetes mellitus Maintain consistent carbohydrate diet Continue Levemir with sliding scale coverage Blood sugar checks with meals    Hypertension Continue amlodipine and lisinopril     Constipation ??  Related to distended bladder noted on CT scan ?? BPH Check bladder scan We will start patient on MiraLAX 17 g daily Patient may need to be started on Flomax        Advance Care Planning:   Code Status: Full Code   Consults: Nephrology  Family Communication: Greater than 50% of time was spent discussing patient's condition and plan of care with him at the bedside.  All questions and concerns have been addressed.  He verbalizes understanding and agrees to the plan.  Severity of Illness: The appropriate patient status for this patient is INPATIENT. Inpatient status is judged to be reasonable and necessary in order to provide the required intensity of service to ensure the patient's safety. The patient's presenting symptoms, physical exam findings, and initial radiographic and laboratory data in the context of their chronic  comorbidities is felt to place them at high risk for further clinical deterioration. Furthermore, it is not anticipated that the patient will be medically stable for discharge from the hospital within 2 midnights of admission.   * I certify that at the point of admission it is my clinical judgment that the patient will require inpatient hospital care spanning beyond 2 midnights from the point of admission due to high intensity of service, high risk for further deterioration and high frequency of surveillance required.*  Author: Lucile Shutters, MD 08/22/2021 9:41 AM  For on call review [email or web address].

## 2021-08-23 DIAGNOSIS — K5900 Constipation, unspecified: Secondary | ICD-10-CM

## 2021-08-23 DIAGNOSIS — E039 Hypothyroidism, unspecified: Secondary | ICD-10-CM

## 2021-08-23 DIAGNOSIS — R7989 Other specified abnormal findings of blood chemistry: Secondary | ICD-10-CM

## 2021-08-23 DIAGNOSIS — E871 Hypo-osmolality and hyponatremia: Principal | ICD-10-CM

## 2021-08-23 LAB — CBC
HCT: 34.1 % — ABNORMAL LOW (ref 39.0–52.0)
Hemoglobin: 12.1 g/dL — ABNORMAL LOW (ref 13.0–17.0)
MCH: 30 pg (ref 26.0–34.0)
MCHC: 35.5 g/dL (ref 30.0–36.0)
MCV: 84.6 fL (ref 80.0–100.0)
Platelets: 368 10*3/uL (ref 150–400)
RBC: 4.03 MIL/uL — ABNORMAL LOW (ref 4.22–5.81)
RDW: 13 % (ref 11.5–15.5)
WBC: 5.1 10*3/uL (ref 4.0–10.5)
nRBC: 0 % (ref 0.0–0.2)

## 2021-08-23 LAB — BASIC METABOLIC PANEL
Anion gap: 5 (ref 5–15)
BUN: 20 mg/dL (ref 6–20)
CO2: 24 mmol/L (ref 22–32)
Calcium: 8.7 mg/dL — ABNORMAL LOW (ref 8.9–10.3)
Chloride: 107 mmol/L (ref 98–111)
Creatinine, Ser: 0.79 mg/dL (ref 0.61–1.24)
GFR, Estimated: >60 mL/min (ref >60)
Glucose, Bld: 108 mg/dL — ABNORMAL HIGH (ref 70–99)
Potassium: 4.4 mmol/L (ref 3.5–5.1)
Sodium: 136 mmol/L (ref 135–145)

## 2021-08-23 LAB — GLUCOSE, CAPILLARY
Glucose-Capillary: 115 mg/dL — ABNORMAL HIGH (ref 70–99)
Glucose-Capillary: 287 mg/dL — ABNORMAL HIGH (ref 70–99)
Glucose-Capillary: 182 mg/dL — ABNORMAL HIGH (ref 70–99)
Glucose-Capillary: 152 mg/dL — ABNORMAL HIGH (ref 70–99)

## 2021-08-23 LAB — SODIUM
Sodium: 131 mmol/L — ABNORMAL LOW (ref 135–145)
Sodium: 131 mmol/L — ABNORMAL LOW (ref 135–145)

## 2021-08-23 MED ORDER — IPRATROPIUM-ALBUTEROL 0.5-2.5 (3) MG/3ML IN SOLN: 3.0000 mL | RESPIRATORY_TRACT | Status: DC | PRN

## 2021-08-23 MED ORDER — METHOCARBAMOL 500 MG PO TABS
500.0000 mg | ORAL_TABLET | Freq: Once | ORAL | Status: AC
  Administered 2021-08-23: 500 mg via ORAL
  Filled 2021-08-23: qty 1

## 2021-08-23 MED ORDER — LACTULOSE 10 GM/15ML PO SOLN
20.0000 g | ORAL | Status: AC
  Administered 2021-08-23 (×2): 20 g via ORAL
  Filled 2021-08-23 (×2): qty 30

## 2021-08-23 MED ORDER — HYDRALAZINE HCL 20 MG/ML IJ SOLN
10.0000 mg | INTRAMUSCULAR | Status: DC | PRN
Start: 2021-08-23 — End: 2021-08-26

## 2021-08-23 MED ORDER — GUAIFENESIN 100 MG/5ML PO LIQD: 5.0000 mL | ORAL | Status: DC | PRN

## 2021-08-23 MED ORDER — TRAZODONE HCL 50 MG PO TABS
50.0000 mg | ORAL_TABLET | Freq: Every evening | ORAL | Status: DC | PRN
Start: 2021-08-23 — End: 2021-08-26
  Administered 2021-08-24 – 2021-08-25 (×2): 50 mg via ORAL
  Filled 2021-08-23 (×2): qty 1

## 2021-08-23 MED ORDER — FLEET ENEMA 7-19 GM/118ML RE ENEM
1.0000 | ENEMA | Freq: Once | RECTAL | Status: AC
  Administered 2021-08-23: 1 via RECTAL

## 2021-08-23 MED ORDER — DEXTROSE 5 % IV SOLN
INTRAVENOUS | Status: DC
Start: 2021-08-23 — End: 2021-08-23

## 2021-08-23 MED ORDER — METOPROLOL TARTRATE 5 MG/5ML IV SOLN: 5.0000 mg | INTRAVENOUS | Status: DC | PRN

## 2021-08-23 MED ORDER — KETOROLAC TROMETHAMINE 30 MG/ML IJ SOLN
30.0000 mg | Freq: Three times a day (TID) | INTRAMUSCULAR | Status: AC
  Administered 2021-08-23 – 2021-08-24 (×3): 30 mg via INTRAVENOUS
  Filled 2021-08-23 (×3): qty 1

## 2021-08-23 MED ORDER — DEXTROSE 5 % IV SOLN: INTRAVENOUS | Status: AC

## 2021-08-23 MED ORDER — SENNOSIDES-DOCUSATE SODIUM 8.6-50 MG PO TABS
1.0000 | ORAL_TABLET | Freq: Every evening | ORAL | Status: DC | PRN
Start: 2021-08-23 — End: 2021-08-26
  Administered 2021-08-24: 1 via ORAL
  Filled 2021-08-23: qty 1

## 2021-08-23 NOTE — Assessment & Plan Note (Addendum)
Continue patient's Lantus, sliding scale and Accu-Chek.  Plan to resume his home regimen upon discharge

## 2021-08-23 NOTE — Progress Notes (Signed)
[facility]  ROUNDING NOTE   Subjective:   We were asked to see the patient for evaluation management of hyponatremia.  It was felt that the patient's hyponatremia was due to excessive free water intake.  This did correct overnight and sodium up to 136.  We have now started the patient on D5W to stabilize the sodium.   Objective:  Vital signs in last 24 hours:  Temp:  [97.8 F (36.6 C)-98.8 F (37.1 C)] 98.8 F (37.1 C) (02/18 0754) Pulse Rate:  [89-97] 91 (02/18 0754) Resp:  [14-20] 14 (02/18 0800) BP: (136-159)/(67-86) 159/67 (02/18 0754) SpO2:  [98 %-100 %] 98 % (02/18 0754)  Weight change:  Filed Weights   08/22/21 0430  Weight: 61.2 kg    Intake/Output:   Intake/Output this shift:  Total I/O In: 2564.5 [P.O.:600; I.V.:1964.5] Out: -   Physical Exam: General: No acute distress  Head: Normocephalic, atraumatic. Moist oral mucosal membranes  Eyes: Anicteric  Neck: Supple  Lungs:  Clear to auscultation, normal effort  Heart: S1S2 no rubs  Abdomen:  Soft, nontender, bowel sounds present  Extremities: Lower extremity edema  Neurologic: Awake, alert, following commands  Skin: No acute rash  Access: No access    Basic Metabolic Panel: Recent Labs  Lab 08/22/21 0441 08/22/21 0600 08/22/21 2127 08/23/21 0534 08/23/21 1343  NA 122* 123* 133* 136 131*  K 5.1 4.9  --  4.4  --   CL 91* 93*  --  107  --   CO2 27 24  --  24  --   GLUCOSE 107* 101*  --  108*  --   BUN 22* 21*  --  20  --   CREATININE 0.81 0.91  --  0.79  --   CALCIUM 8.9 8.8*  --  8.7*  --      Liver Function Tests: Recent Labs  Lab 08/22/21 0441 08/22/21 1504  AST 18 18  ALT 19 21  ALKPHOS 65 71  BILITOT 0.7 0.6  PROT 5.9* 5.5*  ALBUMIN 2.8* 2.6*    Recent Labs  Lab 08/22/21 0441  LIPASE 33    No results for input(s): AMMONIA in the last 168 hours.  CBC: Recent Labs  Lab 08/22/21 0441 08/23/21 0534  WBC 5.8 5.1  NEUTROABS 2.6  --   HGB 13.0 12.1*  HCT  35.2* 34.1*  MCV 83.4 84.6  PLT 368 368     Cardiac Enzymes: No results for input(s): CKTOTAL, CKMB, CKMBINDEX, TROPONINI in the last 168 hours.  BNP: Invalid input(s): POCBNP  CBG: Recent Labs  Lab 08/22/21 1131 08/22/21 1731 08/22/21 2110 08/23/21 0755 08/23/21 1147  GLUCAP 166* 150* 104* 115* 287*     Microbiology: Results for orders placed or performed during the hospital encounter of 08/22/21  Resp Panel by RT-PCR (Flu A&B, Covid) Nasopharyngeal Swab     Status: None   Collection Time: 08/22/21  6:00 AM   Specimen: Nasopharyngeal Swab; Nasopharyngeal(NP) swabs in vial transport medium  Result Value Ref Range Status   SARS Coronavirus 2 by RT PCR NEGATIVE NEGATIVE Final    Comment: (NOTE) SARS-CoV-2 target nucleic acids are NOT DETECTED.  The SARS-CoV-2 RNA is generally detectable in upper respiratory specimens during the acute phase of infection. The lowest concentration of SARS-CoV-2 viral copies this assay can detect is 138 copies/mL. A negative result does not preclude SARS-Cov-2 infection and should not be used as the sole basis for treatment or other patient management decisions. A negative result  may occur with  improper specimen collection/handling, submission of specimen other than nasopharyngeal swab, presence of viral mutation(s) within the areas targeted by this assay, and inadequate number of viral copies(<138 copies/mL). A negative result must be combined with clinical observations, patient history, and epidemiological information. The expected result is Negative.  Fact Sheet for Patients:  BloggerCourse.com  Fact Sheet for Healthcare Providers:  SeriousBroker.it  This test is no t yet approved or cleared by the [place] FDA and  has been authorized for detection and/or diagnosis of SARS-CoV-2 by FDA under an Emergency Use Authorization (EUA). This EUA will remain  in effect (meaning this  test can be used) for the duration of the COVID-19 declaration under Section 564(b)(1) of the Act, 21 U.S.C.section 360bbb-3(b)(1), unless the authorization is terminated  or revoked sooner.       Influenza A by PCR NEGATIVE NEGATIVE Final   Influenza B by PCR NEGATIVE NEGATIVE Final    Comment: (NOTE) The Xpert Xpress SARS-CoV-2/FLU/RSV plus assay is intended as an aid in the diagnosis of influenza from Nasopharyngeal swab specimens and should not be used as a sole basis for treatment. Nasal washings and aspirates are unacceptable for Xpert Xpress SARS-CoV-2/FLU/RSV testing.  Fact Sheet for Patients: BloggerCourse.com  Fact Sheet for Healthcare Providers: SeriousBroker.it  This test is not yet approved or cleared by the [place] FDA and has been authorized for detection and/or diagnosis of SARS-CoV-2 by FDA under an Emergency Use Authorization (EUA). This EUA will remain in effect (meaning this test can be used) for the duration of the COVID-19 declaration under Section 564(b)(1) of the Act, 21 U.S.C. section 360bbb-3(b)(1), unless the authorization is terminated or revoked.  Performed at [facility], [address] [place], [place] [address]     Coagulation Studies: No results for input(s): LABPROT, INR in the last 72 hours.  Urinalysis: Recent Labs    08/22/21 0741  COLORURINE COLORLESS*  LABSPEC 1.011  PHURINE 7.0  GLUCOSEU NEGATIVE  HGBUR NEGATIVE  BILIRUBINUR NEGATIVE  KETONESUR NEGATIVE  PROTEINUR 30*  NITRITE NEGATIVE  LEUKOCYTESUR NEGATIVE       Imaging: DG Chest 2 View  Result Date: 08/22/2021 CLINICAL DATA:  Upper abdominal pain. EXAM: CHEST - 2 VIEW COMPARISON:  None. FINDINGS: The heart size and mediastinal contours are within normal limits. Both lungs are clear. The visualized skeletal structures are unremarkable. IMPRESSION: No active cardiopulmonary disease. Electronically  Signed   By: Lupita Raider M.D.   On: 08/22/2021 06:44   CT ABDOMEN PELVIS W CONTRAST  Result Date: 08/22/2021 CLINICAL DATA:  56 year old male with abdominal distension. Constipation. Abdominal pain for 2 weeks. EXAM: CT ABDOMEN AND PELVIS WITH CONTRAST TECHNIQUE: Multidetector CT imaging of the abdomen and pelvis was performed using the standard protocol following bolus administration of intravenous contrast. RADIATION DOSE REDUCTION: This exam was performed according to the departmental dose-optimization program which includes automated exposure control, adjustment of the mA and/or kV according to patient size and/or use of iterative reconstruction technique. CONTRAST:  OMNIPAQUE IOHEXOL 300 MG/ML  SOLN COMPARISON:  Chest radiographs 0629 hours today. FINDINGS: Lower chest: Negative.  No pericardial or pleural effusion. Hepatobiliary: Negative liver and gallbladder. Pancreas: Negative. Spleen: Negative. Adrenals/Urinary Tract: Normal adrenal glands. Kidneys appear symmetric and normal. Normal renal enhancement and contrast excretion. No nephrolithiasis or pararenal inflammation. Distended but otherwise unremarkable bladder (sagittal image 52). Stomach/Bowel: Negative rectum but retained stool throughout the upstream large bowel which is mildly redundant. No large bowel wall thickening  or inflammation. Right lower quadrant appendix appears to contain oral contrast and otherwise appears normal (coronal image 42). Negative terminal ileum. No dilated small bowel. Unremarkable stomach and duodenum. No free air, free fluid, mesenteric inflammation. Vascular/Lymphatic: No lymphadenopathy identified. Aortoiliac calcified atherosclerosis. And extensive visible femoral artery calcified atherosclerosis. Major arterial structures remain patent. Portal venous system is patent. Reproductive: Negative. Other: No pelvic free fluid. There are relatively large left pelvic floor phleboliths. Musculoskeletal: Negative.  Small left femoral head benign bone island. IMPRESSION: 1. No acute or inflammatory process identified in the abdomen or pelvis. Retained stool throughout the large bowel (sparing the rectum) may reflect constipation. Normal appendix. Distended but otherwise unremarkable bladder, query urinary retention. 2. Aortic Atherosclerosis (ICD10-I70.0). Electronically Signed   By: Odessa Fleming M.D.   On: 08/22/2021 07:11     Medications:    dextrose 100 mL/hr at 08/23/21 1324    amLODipine  10 mg Oral Daily   enoxaparin (LOVENOX) injection  40 mg Subcutaneous Q24H   insulin aspart  0-15 Units Subcutaneous TID WC   insulin glargine-yfgn  10 Units Subcutaneous BID   ketorolac  1 drop Both Eyes QID   ketorolac  30 mg Intravenous Q8H   lisinopril  20 mg Oral BID   pantoprazole  40 mg Oral Daily   polyethylene glycol  17 g Oral Daily   prednisoLONE acetate  1 drop Both Eyes TID   sodium phosphate  1 enema Rectal Once   acetaminophen **OR** acetaminophen, guaiFENesin, hydrALAZINE, ipratropium-albuterol, metoprolol tartrate, ondansetron **OR** ondansetron (ZOFRAN) IV, senna-docusate, traZODone  Assessment/ Plan:  56 y.o. male  with previous medical conditions including diabetes and hypertension, who was admitted to [facility] on 08/22/2021 for Hyponatremia [E87.1]  Hyponatremia likely due to excessive fluid intake.  Status post low-dose tolvaptan one-time dose.  Sodium has significantly improved to 136 as of today's lab currently on D5 water to stabilize sodium. Follow-up labs in a.m.  Diabetes mellitus.  Last A1c 7.5. Continue current         regimen including Lantus monitor blood sugar.  Hypertension continue home regimen with amlodipine and lisinopril.  Monitor blood pressure closely.     LOS: 1 Justise Ehmann P Graceann Boileau 2/18/20232:52 PM

## 2021-08-23 NOTE — Assessment & Plan Note (Signed)
Continue home Norvasc, lisinopril.

## 2021-08-23 NOTE — Progress Notes (Incomplete)
[facility]  ROUNDING NOTE   Subjective:   We were asked to see the patient for evaluation management of hyponatremia.  It was felt that the patient's hyponatremia was due to excessive free water intake. This did correct overnight and sodium up to 136.  We have now started the patient on D5W to stabilize the sodium.  Objective:  Vital signs in last 24 hours:  Temp:  [97.8 F (36.6 C)-98.9 F (37.2 C)] 98.8 F (37.1 C) (02/18 0754) Pulse Rate:  [82-98] 91 (02/18 0754) Resp:  [14-20] 14 (02/18 0800) BP: (136-159)/(67-86) 159/67 (02/18 0754) SpO2:  [98 %-100 %] 98 % (02/18 0754)  Weight change:  Filed Weights   08/22/21 0430  Weight: 61.2 kg    Intake/Output:   Intake/Output this shift:  Total I/O In: 2564.5 [P.O.:600; I.V.:1964.5] Out: -   Physical Exam: General: No acute distress  Head: Normocephalic, atraumatic. Moist oral mucosal membranes  Eyes: Anicteric  Neck: Supple  Lungs:  Clear to auscultation, normal effort  Heart: S1S2 no rubs  Abdomen:  Soft, nontender, bowel sounds present  Extremities: Lower extremity edema  Neurologic: Awake, alert, following commands  Skin: No acute rash  Access: No access    Basic Metabolic Panel: Recent Labs  Lab 08/22/21 0441 08/22/21 0600 08/22/21 2127 08/23/21 0534  NA 122* 123* 133* 136  K 5.1 4.9  --  4.4  CL 91* 93*  --  107  CO2 27 24  --  24  GLUCOSE 107* 101*  --  108*  BUN 22* 21*  --  20  CREATININE 0.81 0.91  --  0.79  CALCIUM 8.9 8.8*  --  8.7*    Liver Function Tests: Recent Labs  Lab 08/22/21 0441 08/22/21 1504  AST 18 18  ALT 19 21  ALKPHOS 65 71  BILITOT 0.7 0.6  PROT 5.9* 5.5*  ALBUMIN 2.8* 2.6*   Recent Labs  Lab 08/22/21 0441  LIPASE 33   No results for input(s): AMMONIA in the last 168 hours.  CBC: Recent Labs  Lab 08/22/21 0441 08/23/21 0534  WBC 5.8 5.1  NEUTROABS 2.6  --   HGB 13.0 12.1*  HCT 35.2* 34.1*  MCV 83.4 84.6  PLT 368 368    Cardiac  Enzymes: No results for input(s): CKTOTAL, CKMB, CKMBINDEX, TROPONINI in the last 168 hours.  BNP: Invalid input(s): POCBNP  CBG: Recent Labs  Lab 08/22/21 1131 08/22/21 1731 08/22/21 2110 08/23/21 0755 08/23/21 1147  GLUCAP 166* 150* 104* 115* 287*    Microbiology: Results for orders placed or performed during the hospital encounter of 08/22/21  Resp Panel by RT-PCR (Flu A&B, Covid) Nasopharyngeal Swab     Status: None   Collection Time: 08/22/21  6:00 AM   Specimen: Nasopharyngeal Swab; Nasopharyngeal(NP) swabs in vial transport medium  Result Value Ref Range Status   SARS Coronavirus 2 by RT PCR NEGATIVE NEGATIVE Final    Comment: (NOTE) SARS-CoV-2 target nucleic acids are NOT DETECTED.  The SARS-CoV-2 RNA is generally detectable in upper respiratory specimens during the acute phase of infection. The lowest concentration of SARS-CoV-2 viral copies this assay can detect is 138 copies/mL. A negative result does not preclude SARS-Cov-2 infection and should not be used as the sole basis for treatment or other patient management decisions. A negative result may occur with  improper specimen collection/handling, submission of specimen other than nasopharyngeal swab, presence of viral mutation(s) within the areas targeted by this assay, and inadequate number of viral copies(<138  copies/mL). A negative result must be combined with clinical observations, patient history, and epidemiological information. The expected result is Negative.  Fact Sheet for Patients:  BloggerCourse.com  Fact Sheet for Healthcare Providers:  SeriousBroker.it  This test is no t yet approved or cleared by the [place] FDA and  has been authorized for detection and/or diagnosis of SARS-CoV-2 by FDA under an Emergency Use Authorization (EUA). This EUA will remain  in effect (meaning this test can be used) for the duration of the COVID-19  declaration under Section 564(b)(1) of the Act, 21 U.S.C.section 360bbb-3(b)(1), unless the authorization is terminated  or revoked sooner.       Influenza A by PCR NEGATIVE NEGATIVE Final   Influenza B by PCR NEGATIVE NEGATIVE Final    Comment: (NOTE) The Xpert Xpress SARS-CoV-2/FLU/RSV plus assay is intended as an aid in the diagnosis of influenza from Nasopharyngeal swab specimens and should not be used as a sole basis for treatment. Nasal washings and aspirates are unacceptable for Xpert Xpress SARS-CoV-2/FLU/RSV testing.  Fact Sheet for Patients: BloggerCourse.com  Fact Sheet for Healthcare Providers: SeriousBroker.it  This test is not yet approved or cleared by the [place] FDA and has been authorized for detection and/or diagnosis of SARS-CoV-2 by FDA under an Emergency Use Authorization (EUA). This EUA will remain in effect (meaning this test can be used) for the duration of the COVID-19 declaration under Section 564(b)(1) of the Act, 21 U.S.C. section 360bbb-3(b)(1), unless the authorization is terminated or revoked.  Performed at [facility], [address] [place], [place] [address]     Coagulation Studies: No results for input(s): LABPROT, INR in the last 72 hours.  Urinalysis: Recent Labs    08/22/21 0741  COLORURINE COLORLESS*  LABSPEC 1.011  PHURINE 7.0  GLUCOSEU NEGATIVE  HGBUR NEGATIVE  BILIRUBINUR NEGATIVE  KETONESUR NEGATIVE  PROTEINUR 30*  NITRITE NEGATIVE  LEUKOCYTESUR NEGATIVE      Imaging: DG Chest 2 View  Result Date: 08/22/2021 CLINICAL DATA:  Upper abdominal pain. EXAM: CHEST - 2 VIEW COMPARISON:  None. FINDINGS: The heart size and mediastinal contours are within normal limits. Both lungs are clear. The visualized skeletal structures are unremarkable. IMPRESSION: No active cardiopulmonary disease. Electronically Signed   By: Lupita Raider M.D.   On: 08/22/2021 06:44    CT ABDOMEN PELVIS W CONTRAST  Result Date: 08/22/2021 CLINICAL DATA:  56 year old male with abdominal distension. Constipation. Abdominal pain for 2 weeks. EXAM: CT ABDOMEN AND PELVIS WITH CONTRAST TECHNIQUE: Multidetector CT imaging of the abdomen and pelvis was performed using the standard protocol following bolus administration of intravenous contrast. RADIATION DOSE REDUCTION: This exam was performed according to the departmental dose-optimization program which includes automated exposure control, adjustment of the mA and/or kV according to patient size and/or use of iterative reconstruction technique. CONTRAST:  OMNIPAQUE IOHEXOL 300 MG/ML  SOLN COMPARISON:  Chest radiographs 0629 hours today. FINDINGS: Lower chest: Negative.  No pericardial or pleural effusion. Hepatobiliary: Negative liver and gallbladder. Pancreas: Negative. Spleen: Negative. Adrenals/Urinary Tract: Normal adrenal glands. Kidneys appear symmetric and normal. Normal renal enhancement and contrast excretion. No nephrolithiasis or pararenal inflammation. Distended but otherwise unremarkable bladder (sagittal image 52). Stomach/Bowel: Negative rectum but retained stool throughout the upstream large bowel which is mildly redundant. No large bowel wall thickening or inflammation. Right lower quadrant appendix appears to contain oral contrast and otherwise appears normal (coronal image 42). Negative terminal ileum. No dilated small bowel. Unremarkable stomach and duodenum. No free air,  free fluid, mesenteric inflammation. Vascular/Lymphatic: No lymphadenopathy identified. Aortoiliac calcified atherosclerosis. And extensive visible femoral artery calcified atherosclerosis. Major arterial structures remain patent. Portal venous system is patent. Reproductive: Negative. Other: No pelvic free fluid. There are relatively large left pelvic floor phleboliths. Musculoskeletal: Negative. Small left femoral head benign bone island. IMPRESSION: 1.  No acute or inflammatory process identified in the abdomen or pelvis. Retained stool throughout the large bowel (sparing the rectum) may reflect constipation. Normal appendix. Distended but otherwise unremarkable bladder, query urinary retention. 2. Aortic Atherosclerosis (ICD10-I70.0). Electronically Signed   By: Odessa Fleming M.D.   On: 08/22/2021 07:11     Medications:    dextrose      amLODipine  10 mg Oral Daily   enoxaparin (LOVENOX) injection  40 mg Subcutaneous Q24H   insulin aspart  0-15 Units Subcutaneous TID WC   insulin glargine-yfgn  10 Units Subcutaneous BID   ketorolac  1 drop Both Eyes QID   ketorolac  30 mg Intravenous Q8H   lactulose  20 g Oral Q4H   lisinopril  20 mg Oral BID   pantoprazole  40 mg Oral Daily   polyethylene glycol  17 g Oral Daily   prednisoLONE acetate  1 drop Both Eyes TID   sodium phosphate  1 enema Rectal Once   acetaminophen **OR** acetaminophen, guaiFENesin, hydrALAZINE, ipratropium-albuterol, metoprolol tartrate, ondansetron **OR** ondansetron (ZOFRAN) IV, senna-docusate, traZODone  Assessment/ Plan:  56 y.o. male  with previous medical conditions including diabetes and hypertension, who was admitted to [facility] on 08/22/2021 for Hyponatremia [E87.1]  Hyponatremia likely due to excessive fluid intake.  Status post low-dose tolvaptan one-time dose.  Sodium has significantly improved to 136 as of today's lab currently on D5 water to stabilize sodium.  Follow-up labs in a.m.  Diabetes mellitus.  Last A1c 7.5. Continue current         regimen including Lantus monitor blood sugar.  Hypertension continue home regimen with amlodipine and lisinopril.  Monitor blood pressure closely.     LOS: 1 Inigo Lantigua P Mckinzey Entwistle 2/18/202312:39 PM

## 2021-08-23 NOTE — Assessment & Plan Note (Addendum)
Initially patient was given tolvaptan which rapidly improved sodium thereafter was placed on D5 water.  Also appears to have stabilized at 130.  CT of the chest did not reveal any lung mass.

## 2021-08-23 NOTE — Progress Notes (Signed)
Cross Cover Dose of robaxin for unrelieved back pain after acetaminophen

## 2021-08-23 NOTE — Assessment & Plan Note (Addendum)
T4 minimally elevated.  We will start daily Synthroid.  Repeat blood work with PCP in about 4 to 6 weeks

## 2021-08-23 NOTE — Progress Notes (Signed)
PROGRESS NOTE    Alexander Kerr  DPO:[id number] DOB: [birth year] DOA: 08/22/2021 PCP: [facility]   Brief Narrative:  For the 56-year-old with history of DM2, HTN admitted to the hospital for ongoing abdominal pain for 2 weeks.  Upon admission he was noted to be hyponatremic, had severe constipation.  He also has significant amount of atherosclerotic disease seen in multiple blood vessels on the CT scan.   Assessment & Plan:  Principal Problem:   Hyponatremia Active Problems:   Constipation   Diabetes mellitus without complication (HCC)   Hypertension   Elevated TSH     Assessment and Plan: * Hyponatremia- (present on admission) This was likely secondary to hypovolemia.  Patient was seen by nephrology, given a dose of tolvaptan.  Sodium appears to be normal this morning.  We will continue to monitor.  Constipation Severe constipation seen which is causing some back pain for him.  Avoid narcotic use.  We will place him on aggressive bowel regimen, order tapwater enema.  Advised him to get out of bed into the chair and ambulate as much as possible.  Elevated TSH Check T4  Hypertension- (present on admission) Continue home Norvasc, lisinopril.  Diabetes mellitus without complication (HCC) Continue patient's Lantus, sliding scale and Accu-Chek.    DVT prophylaxis: Lovenox Code Status: Full code Family Communication: Family at bedside  Status is: Inpatient Remains inpatient appropriate because: Patient is still having quite a bit of back pain likely from severe constipation.  Hyponatremia appears to be better.  Nephrology following.           Subjective: Patient reports to me of lower back pain.  Denies any trauma.  He recently has been quite constipated.  Denies any routine narcotic use.    Examination:  General exam: Appears calm and comfortable  Respiratory system: Clear to auscultation. Respiratory effort normal. Cardiovascular  system: S1 & S2 heard, RRR. No JVD, murmurs, rubs, gallops or clicks. No pedal edema. Gastrointestinal system: Abdomen is nondistended, soft and nontender. No organomegaly or masses felt. Normal bowel sounds heard. Central nervous system: Alert and oriented. No focal neurological deficits. Extremities: Symmetric 5 x 5 power. Skin: No rashes, lesions or ulcers Psychiatry: Judgement and insight appear normal. Mood & affect appropriate.     Objective: Vitals:   08/22/21 1925 08/23/21 0520 08/23/21 0754 08/23/21 0800  BP: 136/76 (!) 145/72 (!) 159/67   Pulse: 97 90 91   Resp: 16 16 18 14   Temp: 98.3 F (36.8 C) 97.8 F (36.6 C) 98.8 F (37.1 C)   TempSrc: Oral Oral Oral   SpO2: 100% 99% 98%   Weight:      Height:        Intake/Output Summary (Last 24 hours) at 08/23/2021 1055 Last data filed at 08/23/2021 1043 Gross per 24 hour  Intake 3572.78 ml  Output --  Net 3572.78 ml   Filed Weights   08/22/21 0430  Weight: 61.2 kg     Data Reviewed:   CBC: Recent Labs  Lab 08/22/21 0441 08/23/21 0534  WBC 5.8 5.1  NEUTROABS 2.6  --   HGB 13.0 12.1*  HCT 35.2* 34.1*  MCV 83.4 84.6  PLT 368 368   Basic Metabolic Panel: Recent Labs  Lab 08/22/21 0441 08/22/21 0600 08/22/21 2127 08/23/21 0534  NA 122* 123* 133* 136  K 5.1 4.9  --  4.4  CL 91* 93*  --  107  CO2 27 24  --  24  GLUCOSE 107*  101*  --  108*  BUN 22* 21*  --  20  CREATININE 0.81 0.91  --  0.79  CALCIUM 8.9 8.8*  --  8.7*   GFR: Estimated Creatinine Clearance: 78.7 mL/min (by C-G formula based on SCr of 0.79 mg/dL). Liver Function Tests: Recent Labs  Lab 08/22/21 0441 08/22/21 1504  AST 18 18  ALT 19 21  ALKPHOS 65 71  BILITOT 0.7 0.6  PROT 5.9* 5.5*  ALBUMIN 2.8* 2.6*   Recent Labs  Lab 08/22/21 0441  LIPASE 33   No results for input(s): AMMONIA in the last 168 hours. Coagulation Profile: No results for input(s): INR, PROTIME in the last 168 hours. Cardiac Enzymes: No results for  input(s): CKTOTAL, CKMB, CKMBINDEX, TROPONINI in the last 168 hours. BNP (last 3 results) No results for input(s): PROBNP in the last 8760 hours. HbA1C: Recent Labs    08/22/21 0934  HGBA1C 7.5*   CBG: Recent Labs  Lab 08/22/21 1131 08/22/21 1731 08/22/21 2110 08/23/21 0755  GLUCAP 166* 150* 104* 115*   Lipid Profile: No results for input(s): CHOL, HDL, LDLCALC, TRIG, CHOLHDL, LDLDIRECT in the last 72 hours. Thyroid Function Tests: Recent Labs    08/22/21 0934  TSH 9.042*   Anemia Panel: No results for input(s): VITAMINB12, FOLATE, FERRITIN, TIBC, IRON, RETICCTPCT in the last 72 hours. Sepsis Labs: No results for input(s): PROCALCITON, LATICACIDVEN in the last 168 hours.  Recent Results (from the past 240 hour(s))  Resp Panel by RT-PCR (Flu A&B, Covid) Nasopharyngeal Swab     Status: None   Collection Time: 08/22/21  6:00 AM   Specimen: Nasopharyngeal Swab; Nasopharyngeal(NP) swabs in vial transport medium  Result Value Ref Range Status   SARS Coronavirus 2 by RT PCR NEGATIVE NEGATIVE Final    Comment: (NOTE) SARS-CoV-2 target nucleic acids are NOT DETECTED.  The SARS-CoV-2 RNA is generally detectable in upper respiratory specimens during the acute phase of infection. The lowest concentration of SARS-CoV-2 viral copies this assay can detect is 138 copies/mL. A negative result does not preclude SARS-Cov-2 infection and should not be used as the sole basis for treatment or other patient management decisions. A negative result may occur with  improper specimen collection/handling, submission of specimen other than nasopharyngeal swab, presence of viral mutation(s) within the areas targeted by this assay, and inadequate number of viral copies(<138 copies/mL). A negative result must be combined with clinical observations, patient history, and epidemiological information. The expected result is Negative.  Fact Sheet for Patients:   BloggerCourse.com  Fact Sheet for Healthcare Providers:  SeriousBroker.it  This test is no t yet approved or cleared by the [place] FDA and  has been authorized for detection and/or diagnosis of SARS-CoV-2 by FDA under an Emergency Use Authorization (EUA). This EUA will remain  in effect (meaning this test can be used) for the duration of the COVID-19 declaration under Section 564(b)(1) of the Act, 21 U.S.C.section 360bbb-3(b)(1), unless the authorization is terminated  or revoked sooner.       Influenza A by PCR NEGATIVE NEGATIVE Final   Influenza B by PCR NEGATIVE NEGATIVE Final    Comment: (NOTE) The Xpert Xpress SARS-CoV-2/FLU/RSV plus assay is intended as an aid in the diagnosis of influenza from Nasopharyngeal swab specimens and should not be used as a sole basis for treatment. Nasal washings and aspirates are unacceptable for Xpert Xpress SARS-CoV-2/FLU/RSV testing.  Fact Sheet for Patients: BloggerCourse.com  Fact Sheet for Healthcare Providers: SeriousBroker.it  This test is not yet  approved or cleared by the [place] FDA and has been authorized for detection and/or diagnosis of SARS-CoV-2 by FDA under an Emergency Use Authorization (EUA). This EUA will remain in effect (meaning this test can be used) for the duration of the COVID-19 declaration under Section 564(b)(1) of the Act, 21 U.S.C. section 360bbb-3(b)(1), unless the authorization is terminated or revoked.  Performed at [facility], [address] [place], [place] [address]          Radiology Studies: DG Chest 2 View  Result Date: 08/22/2021 CLINICAL DATA:  Upper abdominal pain. EXAM: CHEST - 2 VIEW COMPARISON:  None. FINDINGS: The heart size and mediastinal contours are within normal limits. Both lungs are clear. The visualized skeletal structures are unremarkable. IMPRESSION:  No active cardiopulmonary disease. Electronically Signed   By: Lupita RaiderJames  Green Jr M.D.   On: 08/22/2021 06:44   CT ABDOMEN PELVIS W CONTRAST  Result Date: 08/22/2021 CLINICAL DATA:  56 year old male with abdominal distension. Constipation. Abdominal pain for 2 weeks. EXAM: CT ABDOMEN AND PELVIS WITH CONTRAST TECHNIQUE: Multidetector CT imaging of the abdomen and pelvis was performed using the standard protocol following bolus administration of intravenous contrast. RADIATION DOSE REDUCTION: This exam was performed according to the departmental dose-optimization program which includes automated exposure control, adjustment of the mA and/or kV according to patient size and/or use of iterative reconstruction technique. CONTRAST:  100mL OMNIPAQUE IOHEXOL 300 MG/ML  SOLN COMPARISON:  Chest radiographs 0629 hours today. FINDINGS: Lower chest: Negative.  No pericardial or pleural effusion. Hepatobiliary: Negative liver and gallbladder. Pancreas: Negative. Spleen: Negative. Adrenals/Urinary Tract: Normal adrenal glands. Kidneys appear symmetric and normal. Normal renal enhancement and contrast excretion. No nephrolithiasis or pararenal inflammation. Distended but otherwise unremarkable bladder (sagittal image 52). Stomach/Bowel: Negative rectum but retained stool throughout the upstream large bowel which is mildly redundant. No large bowel wall thickening or inflammation. Right lower quadrant appendix appears to contain oral contrast and otherwise appears normal (coronal image 42). Negative terminal ileum. No dilated small bowel. Unremarkable stomach and duodenum. No free air, free fluid, mesenteric inflammation. Vascular/Lymphatic: No lymphadenopathy identified. Aortoiliac calcified atherosclerosis. And extensive visible femoral artery calcified atherosclerosis. Major arterial structures remain patent. Portal venous system is patent. Reproductive: Negative. Other: No pelvic free fluid. There are relatively large left  pelvic floor phleboliths. Musculoskeletal: Negative. Small left femoral head benign bone island. IMPRESSION: 1. No acute or inflammatory process identified in the abdomen or pelvis. Retained stool throughout the large bowel (sparing the rectum) may reflect constipation. Normal appendix. Distended but otherwise unremarkable bladder, query urinary retention. 2. Aortic Atherosclerosis (ICD10-I70.0). Electronically Signed   By: Odessa FlemingH  Hall M.D.   On: 08/22/2021 07:11        Scheduled Meds:  amLODipine  10 mg Oral Daily   enoxaparin (LOVENOX) injection  40 mg Subcutaneous Q24H   insulin aspart  0-15 Units Subcutaneous TID WC   insulin glargine-yfgn  10 Units Subcutaneous BID   ketorolac  1 drop Both Eyes QID   ketorolac  30 mg Intravenous Q8H   lactulose  20 g Oral Q4H   lisinopril  20 mg Oral BID   pantoprazole  40 mg Oral Daily   polyethylene glycol  17 g Oral Daily   prednisoLONE acetate  1 drop Both Eyes TID   Continuous Infusions:  sodium chloride 100 mL/hr at 08/23/21 1043   dextrose       LOS: 1 day   Time spent= 35 mins    Osmar Howton Chirag Ronnisha Felber,  MD [facility]  If 7PM-7AM, please contact night-coverage  08/23/2021, 10:55 AM

## 2021-08-23 NOTE — Plan of Care (Signed)
°  Problem: Clinical Measurements: Goal: Ability to maintain clinical measurements within normal limits will improve Outcome: Progressing Goal: Will remain free from infection Outcome: Progressing Goal: Diagnostic test results will improve Outcome: Progressing Goal: Respiratory complications will improve Outcome: Progressing Goal: Cardiovascular complication will be avoided Outcome: Progressing   Problem: Pain Managment: Goal: General experience of comfort will improve Outcome: Progressing   Pt is alert and orientedx4. V/S stable. Complained of back pain; robaxin ordered 1x by NP and PRN tylenol given, with slight relief. BM noted 1x this shift.

## 2021-08-23 NOTE — Assessment & Plan Note (Addendum)
This appears to have improved.  Advised to continue ambulation and use stool softeners as necessary to have daily bowel movements.

## 2021-08-24 ENCOUNTER — Inpatient Hospital Stay: Payer: Self-pay

## 2021-08-24 ENCOUNTER — Encounter: Payer: Self-pay | Admitting: Internal Medicine

## 2021-08-24 DIAGNOSIS — M545 Low back pain, unspecified: Secondary | ICD-10-CM

## 2021-08-24 LAB — GLUCOSE, CAPILLARY
Glucose-Capillary: 140 mg/dL — ABNORMAL HIGH (ref 70–99)
Glucose-Capillary: 145 mg/dL — ABNORMAL HIGH (ref 70–99)
Glucose-Capillary: 231 mg/dL — ABNORMAL HIGH (ref 70–99)
Glucose-Capillary: 154 mg/dL — ABNORMAL HIGH (ref 70–99)

## 2021-08-24 LAB — BASIC METABOLIC PANEL
Anion gap: 6 (ref 5–15)
BUN: 23 mg/dL — ABNORMAL HIGH (ref 6–20)
CO2: 23 mmol/L (ref 22–32)
Calcium: 8.4 mg/dL — ABNORMAL LOW (ref 8.9–10.3)
Chloride: 99 mmol/L (ref 98–111)
Creatinine, Ser: 0.78 mg/dL (ref 0.61–1.24)
GFR, Estimated: >60 mL/min (ref >60)
Glucose, Bld: 152 mg/dL — ABNORMAL HIGH (ref 70–99)
Potassium: 4.7 mmol/L (ref 3.5–5.1)
Sodium: 128 mmol/L — ABNORMAL LOW (ref 135–145)

## 2021-08-24 LAB — CBC
HCT: 32.1 % — ABNORMAL LOW (ref 39.0–52.0)
Hemoglobin: 11.3 g/dL — ABNORMAL LOW (ref 13.0–17.0)
MCH: 29.8 pg (ref 26.0–34.0)
MCHC: 35.2 g/dL (ref 30.0–36.0)
MCV: 84.7 fL (ref 80.0–100.0)
Platelets: 363 10*3/uL (ref 150–400)
RBC: 3.79 MIL/uL — ABNORMAL LOW (ref 4.22–5.81)
RDW: 12.9 % (ref 11.5–15.5)
WBC: 5.5 10*3/uL (ref 4.0–10.5)
nRBC: 0 % (ref 0.0–0.2)

## 2021-08-24 LAB — MAGNESIUM: Magnesium: 1.9 mg/dL (ref 1.7–2.4)

## 2021-08-24 LAB — SODIUM
Sodium: 129 mmol/L — ABNORMAL LOW (ref 135–145)
Sodium: 130 mmol/L — ABNORMAL LOW (ref 135–145)

## 2021-08-24 IMAGING — CR DG ABDOMEN 1V
1 series · 1 of 1 positions shown · non-contrast
Comparison: CT scan [DATE]

CLINICAL DATA: Abdominal distention.

EXAM:
ABDOMEN - 1 VIEW

[abdomen kub]
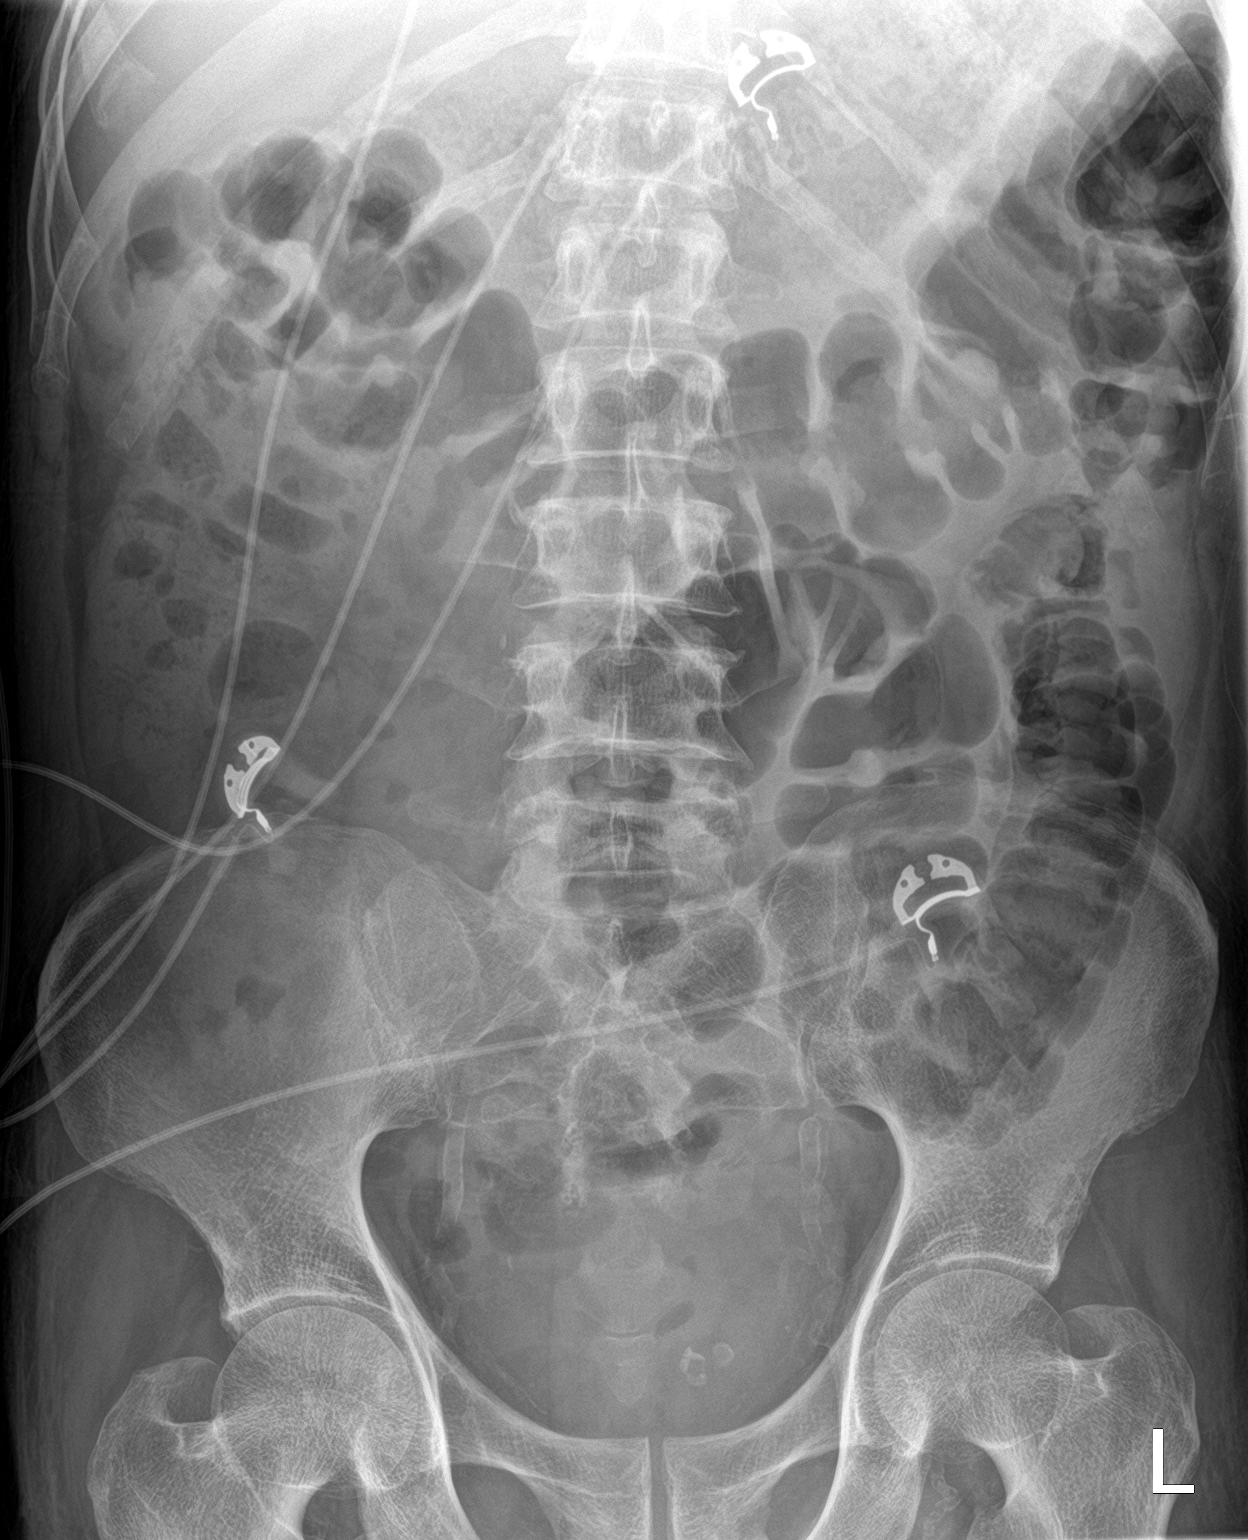

[1 of 1 positions shown; findings below may reference images not displayed]

FINDINGS: Diffuse gaseous distention of the colon evident. No substantial
small bowel gaseous dilatation. Visualized bony anatomy
unremarkable.
IMPRESSION: Diffuse gaseous distention of the colon. Component of ileus not
excluded.

## 2021-08-24 IMAGING — CT CT CHEST W/ CM
2 of 4 series · 15 of 36 positions shown, 18 images · IV contrast (agent unspecified)
Comparison: None.

CLINICAL DATA: Hyponatremia, evaluate for underlying lung mass

EXAM:
CT CHEST WITH CONTRAST
TECHNIQUE: Multidetector CT imaging of the chest was performed during
intravenous contrast administration.

[Series 2: axial st · axial · 0.66mm/px · z∈[-318,-78]mm · 12 of 142 slices shown, 15 images]
[im 11/142  mediastinal]
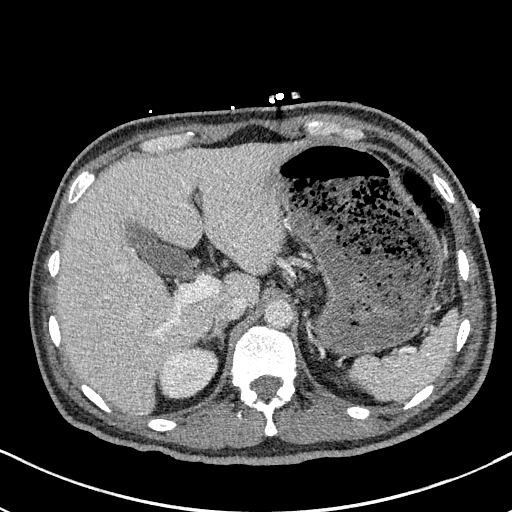
[im 11/142  lung]
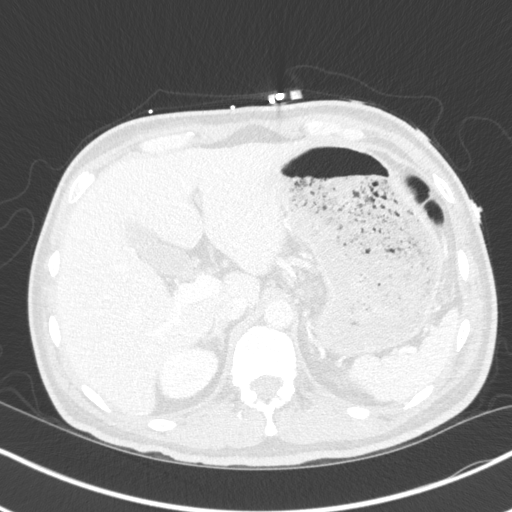
[im 22/142  lung]
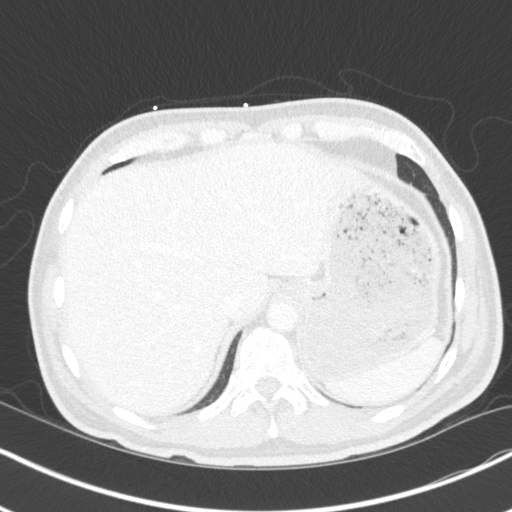
[im 33/142  lung]
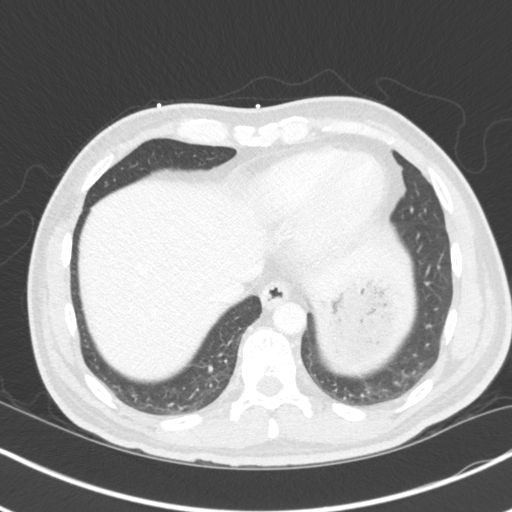
[im 44/142  lung]
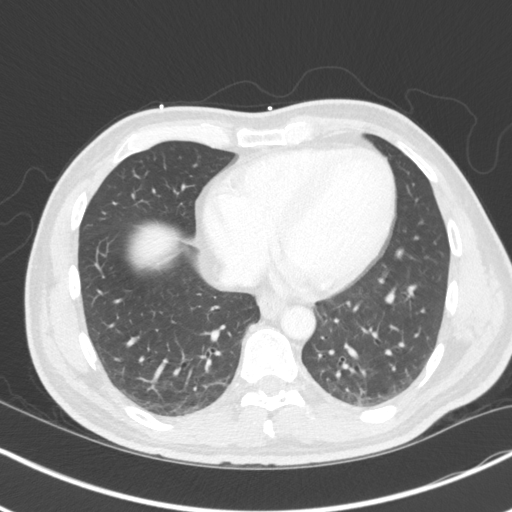
[im 55/142  mediastinal]
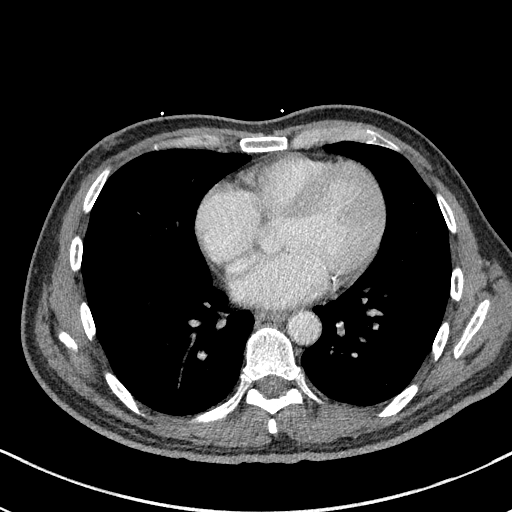
[im 55/142  lung]
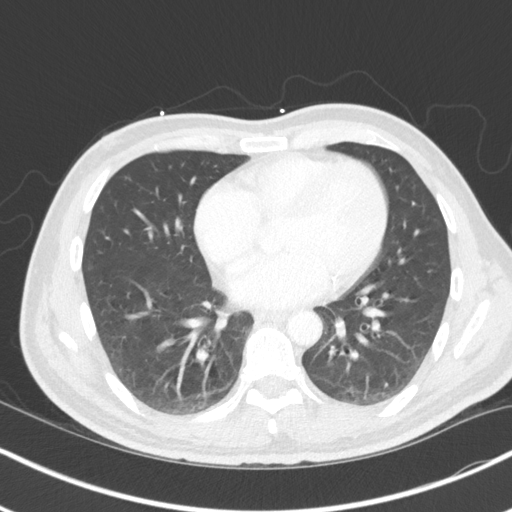
[im 66/142  lung]
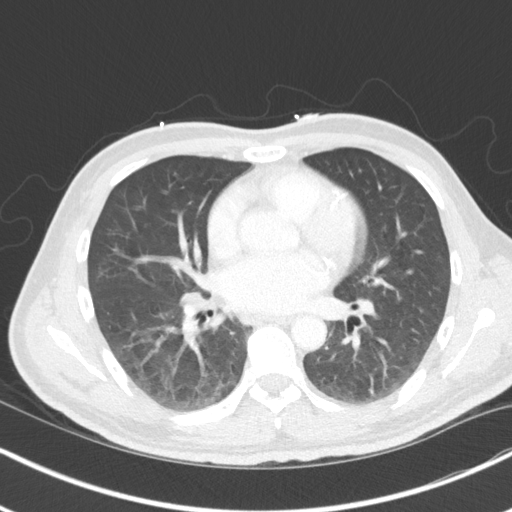
[im 76/142  lung]
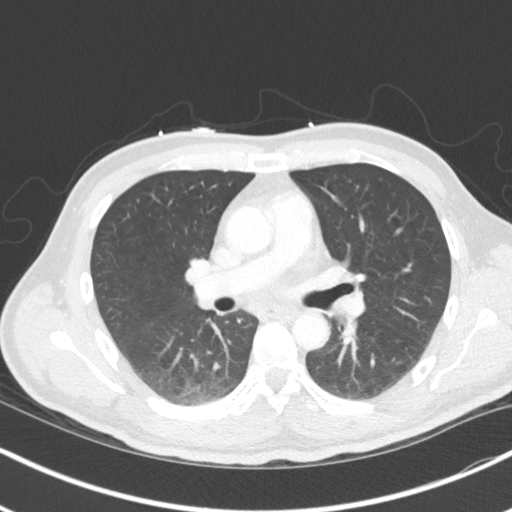
[im 87/142  lung]
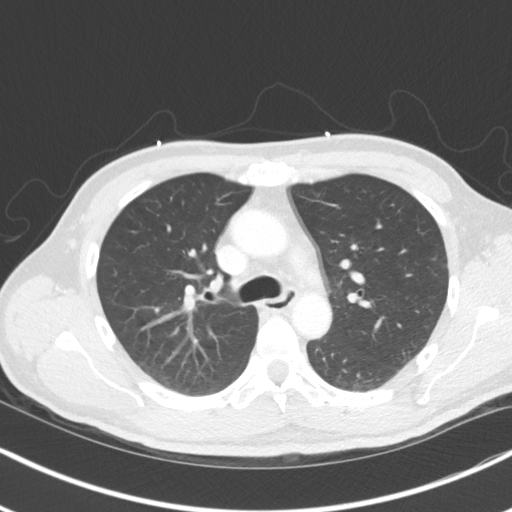
[im 98/142  mediastinal]
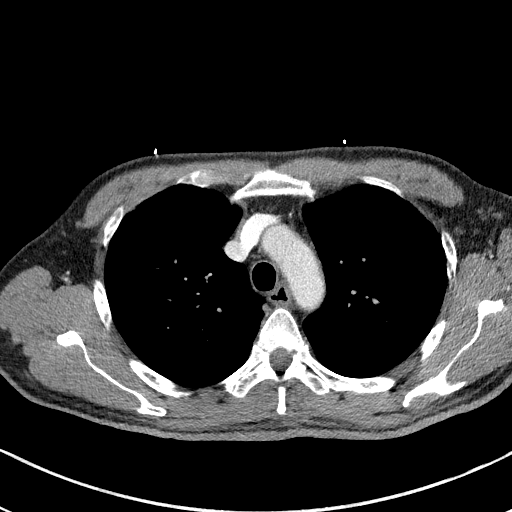
[im 98/142  lung]
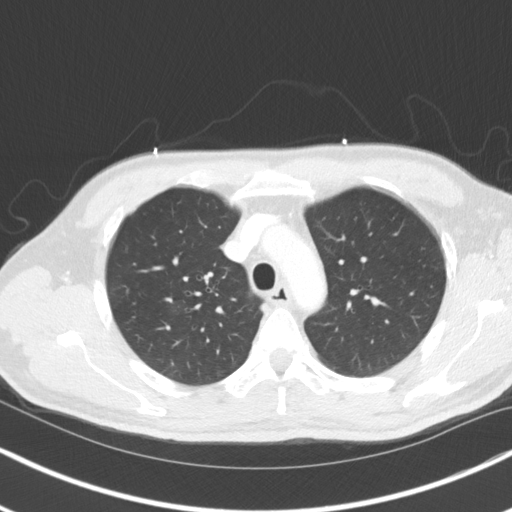
[im 109/142  lung]
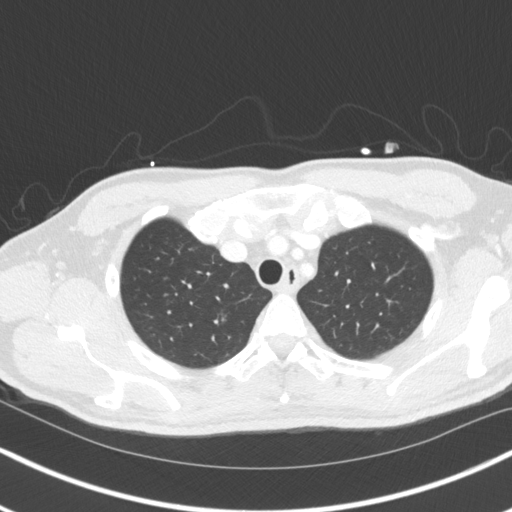
[im 120/142  lung]
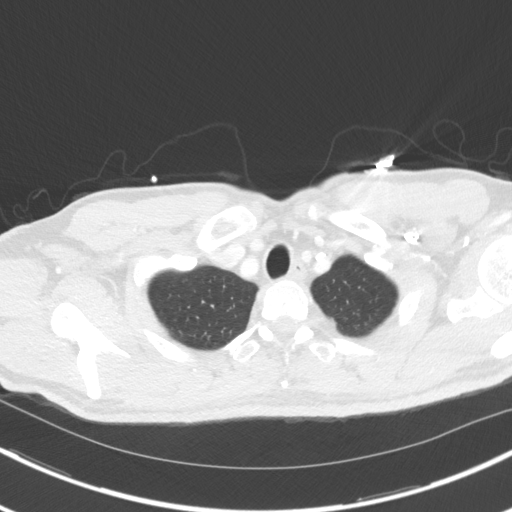
[im 131/142  lung]
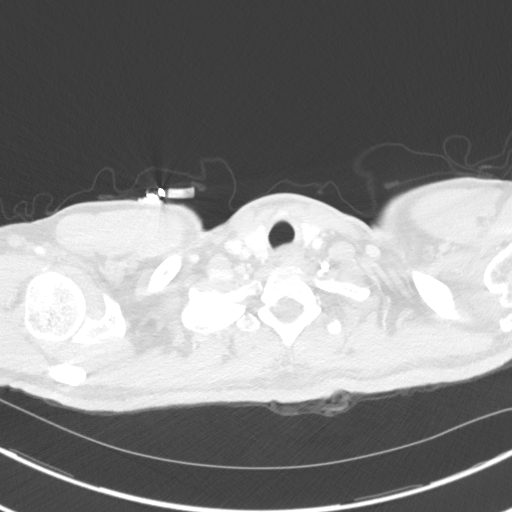

[Series 5: coronal · coronal · 0.66mm/px · 3 of 123 slices shown]
[im 25/123  lung]
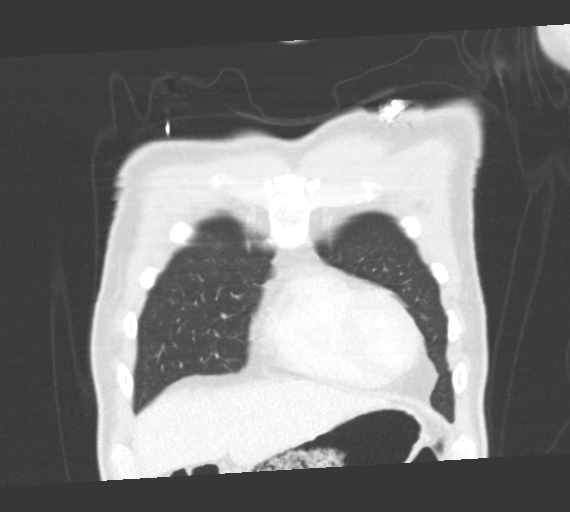
[im 49/123  lung]
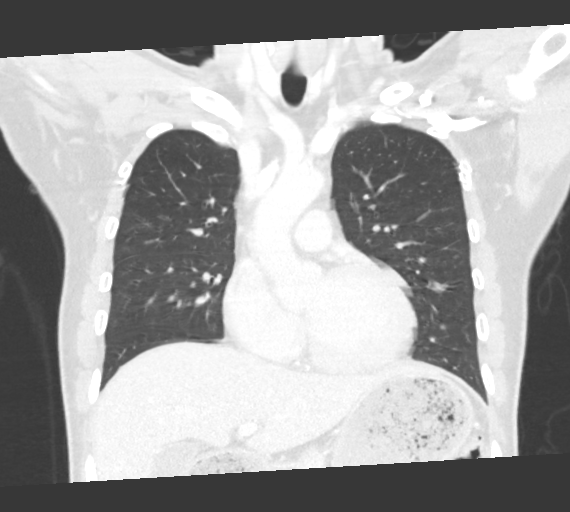
[im 74/123  lung]
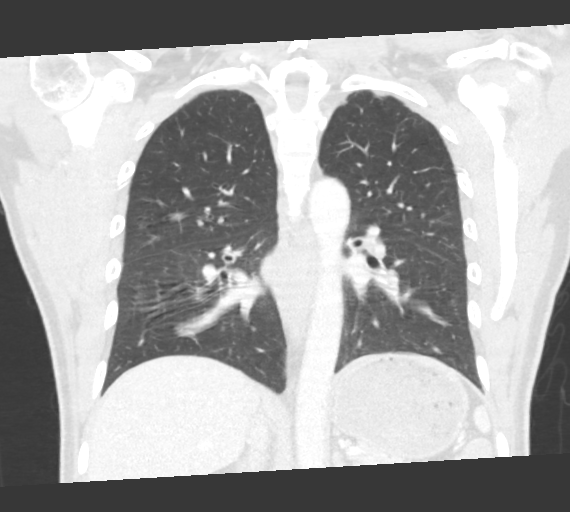

[15 of 36 positions shown; findings below may reference images not displayed]

RADIATION DOSE REDUCTION: This exam was performed according to the
departmental dose-optimization program which includes automated
exposure control, adjustment of the mA and/or kV according to
patient size and/or use of iterative reconstruction technique.

CONTRAST:  75mL OMNIPAQUE IOHEXOL 300 MG/ML  SOLN
FINDINGS: Cardiovascular: Aortic atherosclerosis. Normal heart size.
Three-vessel coronary artery calcifications. No pericardial
effusion.

Mediastinum/Nodes: No enlarged mediastinal, hilar, or axillary lymph
nodes. Thyroid gland, trachea, and esophagus demonstrate no
significant findings.

Lungs/Pleura: Minimal dependent bibasilar partial atelectasis or
scarring. Occasional small pulmonary nodules for example in the
dependent left lung base measuring 0.4 cm (series 3, image 94) no
pleural effusion or pneumothorax.

Upper Abdomen: No acute abnormality.

Musculoskeletal: No chest wall abnormality. No suspicious osseous
lesions identified.
IMPRESSION: 1. No evidence of mass or lymphadenopathy in the chest.
2. Occasional small pulmonary nodules, measuring no greater than
cm. No follow-up needed if patient is low-risk (and has no known or
suspected primary neoplasm). Non-contrast chest CT can be considered
in 12 months if patient is high-risk. This recommendation follows
the consensus statement: Guidelines for Management of Incidental
Pulmonary Nodules Detected on CT Images: From the [HOSPITAL]
3. Coronary artery disease.

Aortic Atherosclerosis ([L9]-[L9]).

## 2021-08-24 IMAGING — MR MR LUMBAR SPINE WO/W CM
6 of 7 series · 31 of 48 positions shown · IV contrast (6ml Gadavist)
Comparison: CT Abdomen and Pelvis [DATE].

CLINICAL DATA: 56-year-old male with low back pain, possible
infection.

EXAM:
MRI LUMBAR SPINE WITHOUT AND WITH CONTRAST
TECHNIQUE: Multiplanar and multiecho pulse sequences of the lumbar spine were
obtained without and with intravenous contrast.
CONTRAST:  6mL GADAVIST GADOBUTROL 1 MMOL/ML IV SOLN

[Series 5: T2 · sagittal · 4.0mm · 0.81mm/px · 4 of 17 slices shown (1 of 2)]
[im 1/17]
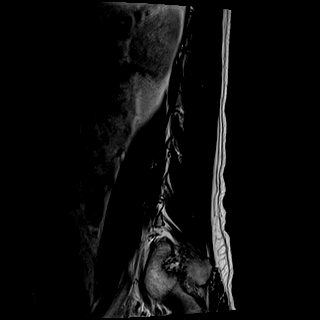
[im 6/17]
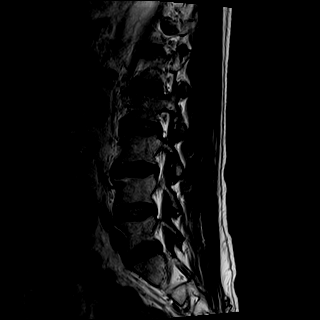
[im 11/17]
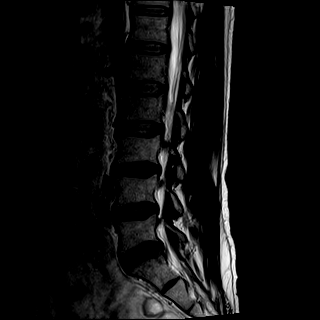
[im 17/17]
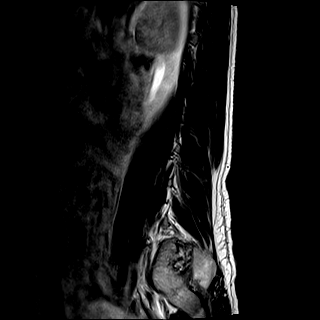

[Series 6: T1 · sagittal · 4.0mm · 0.81mm/px · 4 of 17 slices shown (1 of 2)]
[im 1/17]
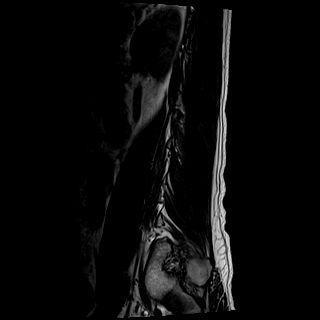
[im 6/17]
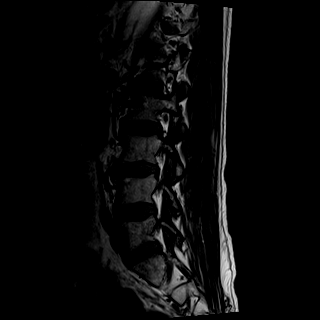
[im 11/17]
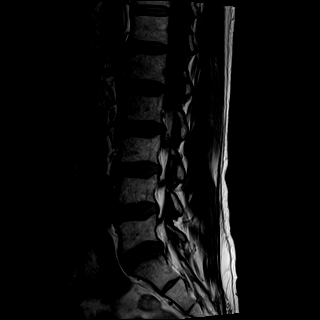
[im 17/17]
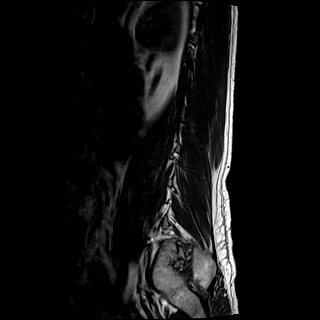

[Series 7: STIR · sagittal · 4.0mm · 0.41mm/px · 2 of 17 slices shown]
[im 1/17]
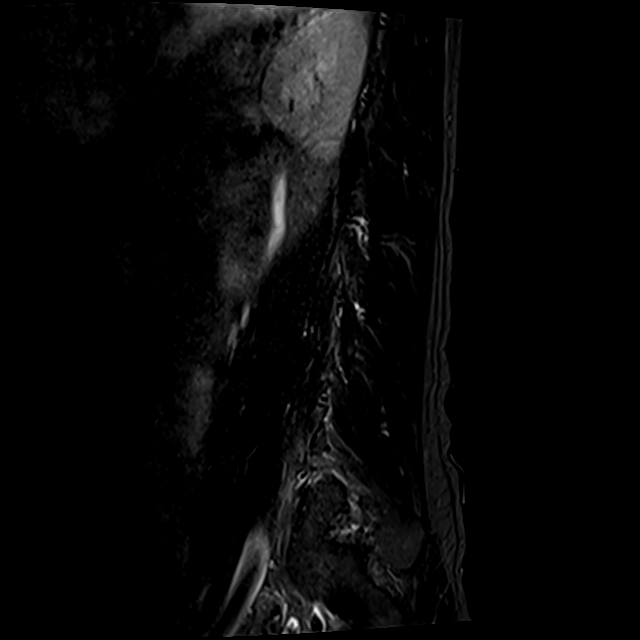
[im 5/17]
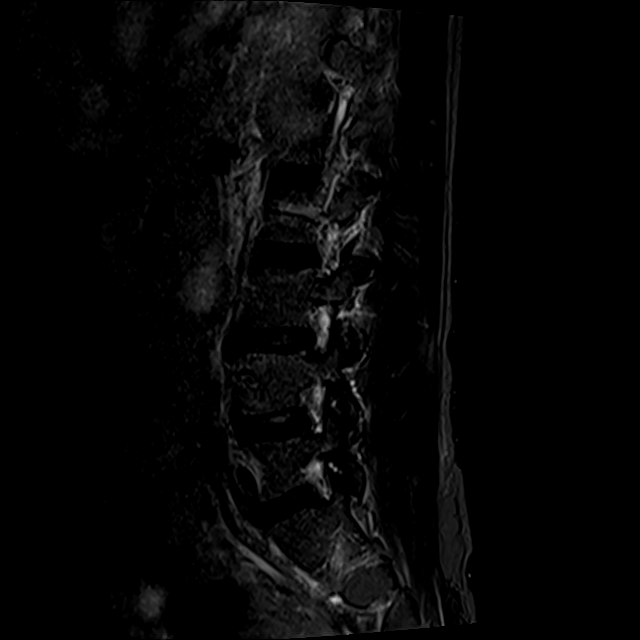

[Series 8: T2 · axial · 4.0mm · 0.78mm/px · z∈[-77,+132]mm · 8 of 36 slices shown (2 of 2)]
[im 1/36]
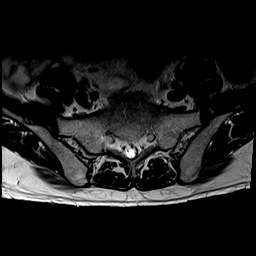
[im 4/36]
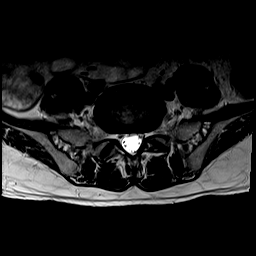
[im 12/36]
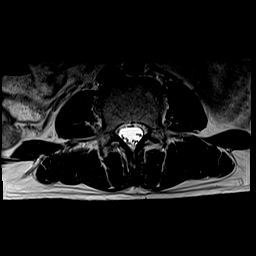
[im 16/36]
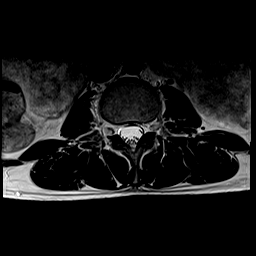
[im 20/36]
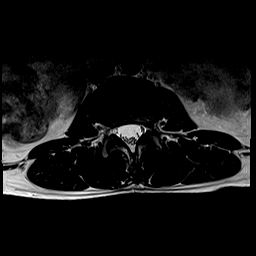
[im 24/36]
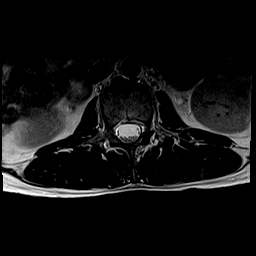
[im 32/36]
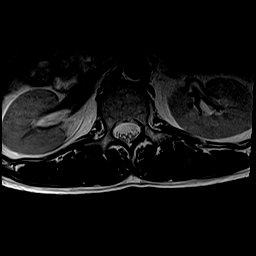
[im 36/36]
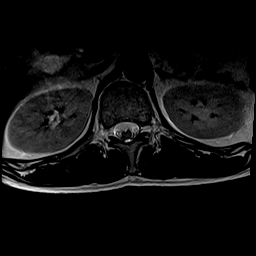

[Series 9: T1 · axial · 4.0mm · 0.39mm/px · z∈[-77,+132]mm · 8 of 36 slices shown (2 of 2)]
[im 1/36]
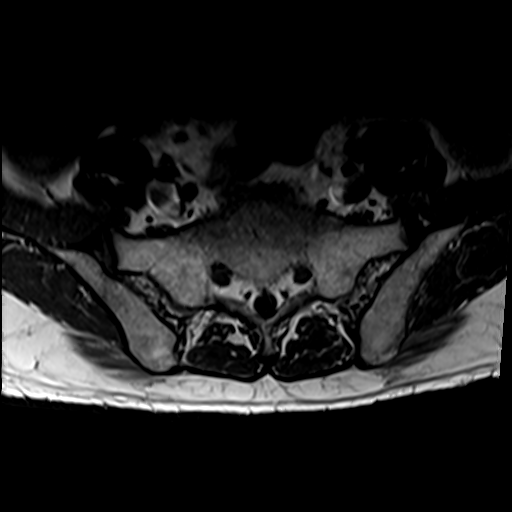
[im 4/36]
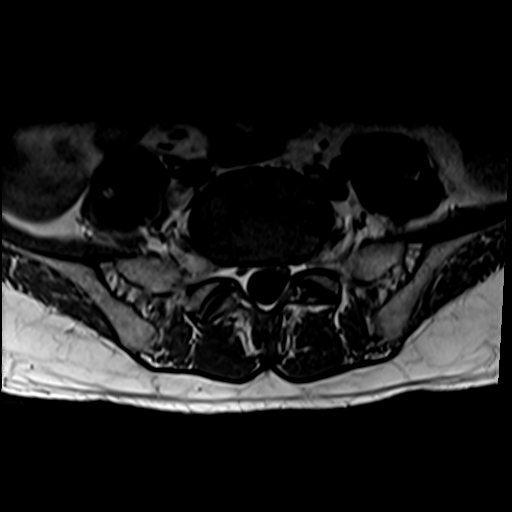
[im 12/36]
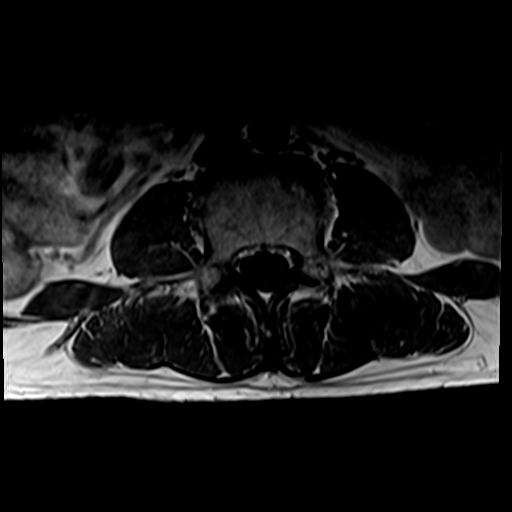
[im 16/36]
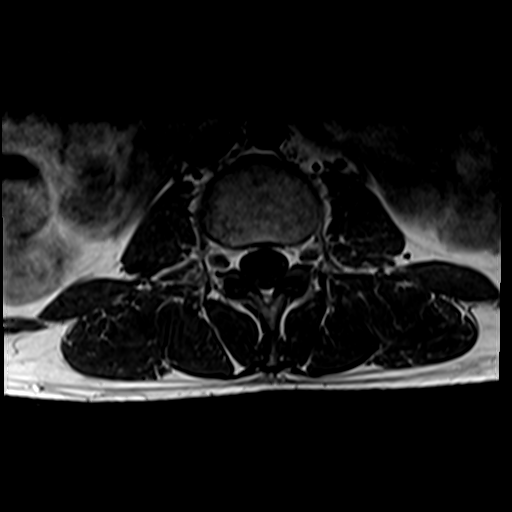
[im 20/36]
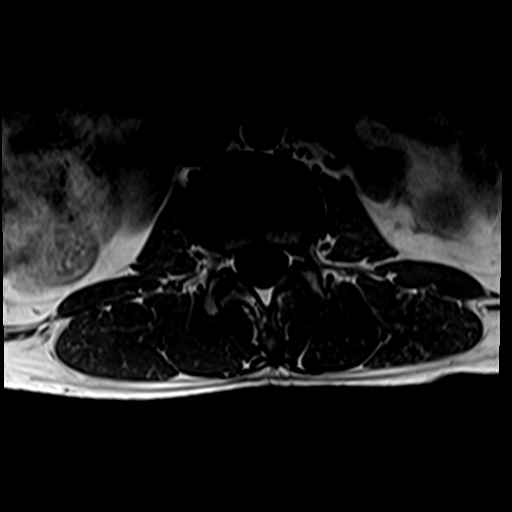
[im 24/36]
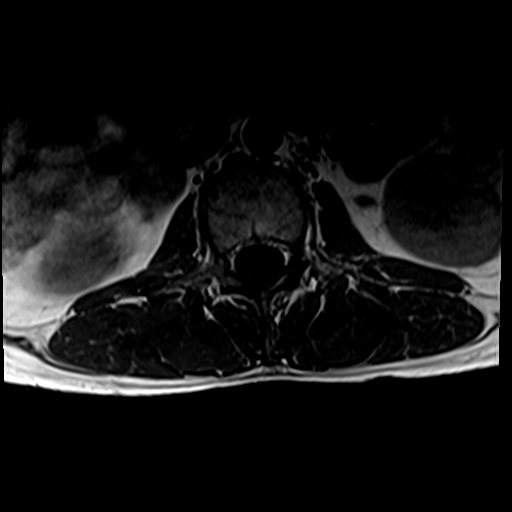
[im 32/36]
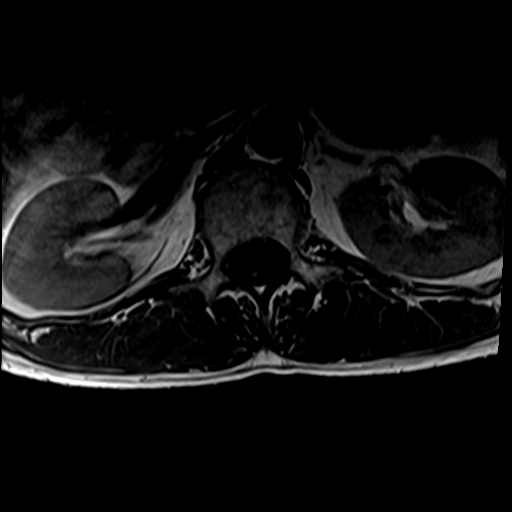
[im 36/36]
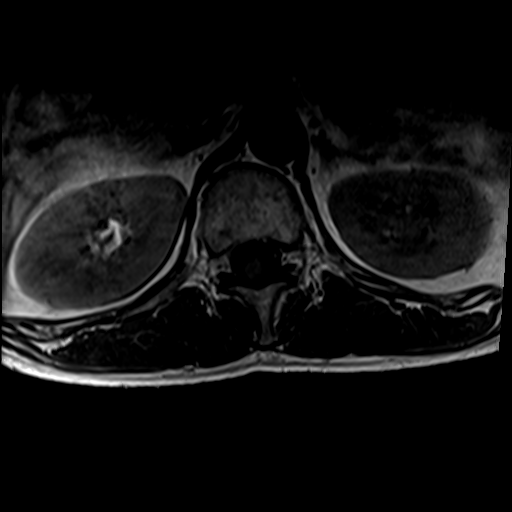

[Series 10: T1 fat-sat post-contrast · sagittal · 4.0mm · 0.81mm/px · 5 of 17 slices shown]
[im 1/17]
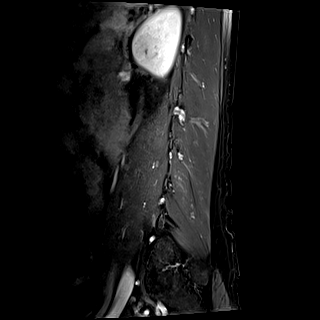
[im 5/17]
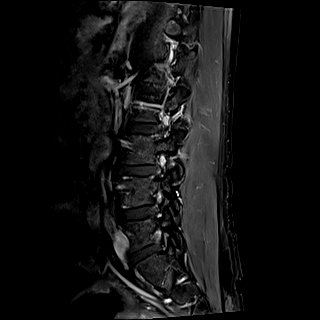
[im 9/17]
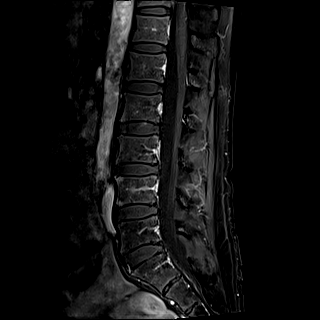
[im 13/17]
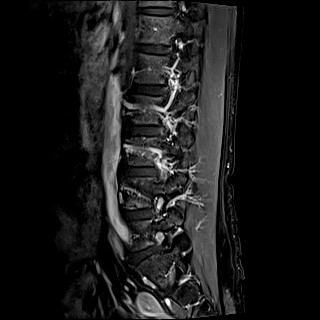
[im 17/17]
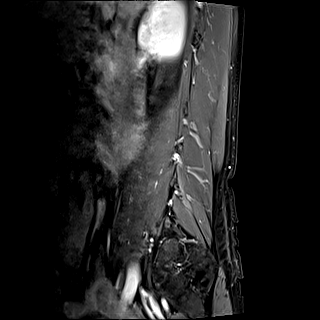

[31 of 48 positions shown; findings below may reference images not displayed]

FINDINGS: Segmentation:  Normal on the comparison.

Alignment:  Straightening of lumbar lordosis, otherwise normal.

Vertebrae: No marrow edema or evidence of acute osseous abnormality.
Visualized bone marrow signal is within normal limits. Intact
visible sacrum.

Conus medullaris and cauda equina: Conus extends to the T12-L1
level. No lower spinal cord or conus signal abnormality. No abnormal
intradural enhancement or dural thickening. Normal cauda equina
nerve roots.

Paraspinal and other soft tissues: Negative.

Disc levels:

T11-T12: Negative.

T12-L1:  Negative.

L1-L2:  Negative.

L2-L3:  Negative.

L3-L4: Mild disc desiccation and circumferential disc bulge. No
spinal or lateral recess stenosis. Borderline to mild bilateral L3
foraminal stenosis.

L4-L5: Subtle left far lateral disc protrusion with annular fissure
and enhancement (series 8, image 28). But otherwise negative. No
spinal stenosis or foraminal involvement.

L5-S1:  Subtle circumferential disc bulging. Otherwise negative.
IMPRESSION: 1. No evidence of infection and largely normal for age MRI
appearance of the lumbar spine.
2. Left far lateral disc herniation at L4-L5, but with no associated
spinal stenosis or convincing neural impingement.
3. Minimal lumbar spine degeneration otherwise. Borderline to mild
bilateral L3 neural foraminal stenosis.

## 2021-08-24 MED ORDER — TOLVAPTAN 15 MG PO TABS
15.0000 mg | ORAL_TABLET | Freq: Once | ORAL | Status: AC
Start: 2021-08-24 — End: 2021-08-24
  Administered 2021-08-24: 15 mg via ORAL
  Filled 2021-08-24: qty 1

## 2021-08-24 MED ORDER — LACTULOSE 10 GM/15ML PO SOLN
20.0000 g | Freq: Four times a day (QID) | ORAL | Status: AC
  Administered 2021-08-24 – 2021-08-25 (×6): 20 g via ORAL
  Filled 2021-08-24 (×5): qty 30

## 2021-08-24 MED ORDER — IOHEXOL 300 MG/ML  SOLN
75.0000 mL | Freq: Once | INTRAMUSCULAR | Status: AC | PRN
  Administered 2021-08-24: 75 mL via INTRAVENOUS

## 2021-08-24 MED ORDER — GADOBUTROL 1 MMOL/ML IV SOLN
6.0000 mL | Freq: Once | INTRAVENOUS | Status: AC | PRN
  Administered 2021-08-24: 6 mL via INTRAVENOUS

## 2021-08-24 NOTE — Progress Notes (Signed)
PROGRESS NOTE    Alexander Kerr  WIO:[id number] DOB: June 03, 1966 DOA: 08/22/2021 PCP: [facility]   Brief Narrative:   56 year old with history of DM2, HTN admitted to the hospital for ongoing abdominal pain for 2 weeks.  Upon admission he was noted to be hyponatremic, had severe constipation.  He also has significant amount of atherosclerotic disease seen in multiple blood vessels on the CT scan.   Assessment & Plan:  Principal Problem:   Hyponatremia Active Problems:   Constipation   Low back pain   Diabetes mellitus without complication (HCC)   Hypertension   Elevated TSH     Assessment and Plan: * Hyponatremia- (present on admission) Patient was given a dose of tolvaptan, initially sodium levels improved thereafter he was given D5 water.  This morning it is slightly trended down.  He is getting CT chest to evaluate for lung mass today.  Low back pain He is having significant amount of low back pain.  I suspect this is is secondary to severe constipation but will obtain MRI lumbar spine to rule out any acute pathology.  Constipation Severe constipation is likely causing him to have significant amount of back pain.  Aggressive bowel regimen and enema has been ordered. Repeat KUB    Elevated TSH T4 is currently pending  Hypertension- (present on admission) Continue home Norvasc, lisinopril.  Diabetes mellitus without complication (HCC) Continue patient's Lantus, sliding scale and Accu-Chek.          DVT prophylaxis: Lovenox Code Status: Full code Family Communication: None at bedside  Status is: Inpatient Remains inpatient appropriate because: Patient still having significant amount of lower back pain and ongoing evaluation for hyponatremia.  Maintain hospital stay until cleared by nephrology.  And his back pain is improved   Subjective: Interpreter used.  Interpreter ID [id number]  Patient seen and examined at bedside, reporting  still having quite a bit of lower back pain.  Denies any trauma or previous history of this.  He had couple of small bowel movements yesterday but none since.    Examination:  General exam: Appears calm and comfortable  Respiratory system: Clear to auscultation. Respiratory effort normal. Cardiovascular system: S1 & S2 heard, RRR. No JVD, murmurs, rubs, gallops or clicks. No pedal edema. Gastrointestinal system: Abdomen is nondistended, soft and nontender. No organomegaly or masses felt. Normal bowel sounds heard. Central nervous system: Alert and oriented. No focal neurological deficits. Extremities: Symmetric 5 x 5 power. Skin: No rashes, lesions or ulcers Psychiatry: Judgement and insight appear normal. Mood & affect appropriate.     Objective: Vitals:   08/23/21 1949 08/23/21 2000 08/24/21 0429 08/24/21 0801  BP: (!) 165/69  (!) 152/80 (!) 168/76  Pulse: 89  80 84  Resp: 20  18 18   Temp:  (!) 97.2 F (36.2 C) 97.8 F (36.6 C) 97.9 F (36.6 C)  TempSrc:  Oral  Oral  SpO2: 100%  100% 100%  Weight:      Height:        Intake/Output Summary (Last 24 hours) at 08/24/2021 1208 Last data filed at 08/23/2021 1944 Gross per 24 hour  Intake 1449.34 ml  Output --  Net 1449.34 ml   Filed Weights   08/22/21 0430  Weight: 61.2 kg     Data Reviewed:   CBC: Recent Labs  Lab 08/22/21 0441 08/23/21 0534 08/24/21 0504  WBC 5.8 5.1 5.5  NEUTROABS 2.6  --   --   HGB 13.0 12.1* 11.3*  HCT 35.2* 34.1* 32.1*  MCV 83.4 84.6 84.7  PLT 368 368 363   Basic Metabolic Panel: Recent Labs  Lab 08/22/21 0441 08/22/21 0600 08/22/21 2127 08/23/21 0534 08/23/21 1343 08/23/21 1951 08/24/21 0504  NA 122* 123* 133* 136 131* 131* 128*  K 5.1 4.9  --  4.4  --   --  4.7  CL 91* 93*  --  107  --   --  99  CO2 27 24  --  24  --   --  23  GLUCOSE 107* 101*  --  108*  --   --  152*  BUN 22* 21*  --  20  --   --  23*  CREATININE 0.81 0.91  --  0.79  --   --  0.78  CALCIUM 8.9 8.8*   --  8.7*  --   --  8.4*  MG  --   --   --   --   --   --  1.9   GFR: Estimated Creatinine Clearance: 78.7 mL/min (by C-G formula based on SCr of 0.78 mg/dL). Liver Function Tests: Recent Labs  Lab 08/22/21 0441 08/22/21 1504  AST 18 18  ALT 19 21  ALKPHOS 65 71  BILITOT 0.7 0.6  PROT 5.9* 5.5*  ALBUMIN 2.8* 2.6*   Recent Labs  Lab 08/22/21 0441  LIPASE 33   No results for input(s): AMMONIA in the last 168 hours. Coagulation Profile: No results for input(s): INR, PROTIME in the last 168 hours. Cardiac Enzymes: No results for input(s): CKTOTAL, CKMB, CKMBINDEX, TROPONINI in the last 168 hours. BNP (last 3 results) No results for input(s): PROBNP in the last 8760 hours. HbA1C: Recent Labs    08/22/21 0934  HGBA1C 7.5*   CBG: Recent Labs  Lab 08/23/21 0755 08/23/21 1147 08/23/21 1554 08/23/21 2135 08/24/21 0809  GLUCAP 115* 287* 182* 152* 140*   Lipid Profile: No results for input(s): CHOL, HDL, LDLCALC, TRIG, CHOLHDL, LDLDIRECT in the last 72 hours. Thyroid Function Tests: Recent Labs    08/22/21 0934  TSH 9.042*   Anemia Panel: No results for input(s): VITAMINB12, FOLATE, FERRITIN, TIBC, IRON, RETICCTPCT in the last 72 hours. Sepsis Labs: No results for input(s): PROCALCITON, LATICACIDVEN in the last 168 hours.  Recent Results (from the past 240 hour(s))  Resp Panel by RT-PCR (Flu A&B, Covid) Nasopharyngeal Swab     Status: None   Collection Time: 08/22/21  6:00 AM   Specimen: Nasopharyngeal Swab; Nasopharyngeal(NP) swabs in vial transport medium  Result Value Ref Range Status   SARS Coronavirus 2 by RT PCR NEGATIVE NEGATIVE Final    Comment: (NOTE) SARS-CoV-2 target nucleic acids are NOT DETECTED.  The SARS-CoV-2 RNA is generally detectable in upper respiratory specimens during the acute phase of infection. The lowest concentration of SARS-CoV-2 viral copies this assay can detect is 138 copies/mL. A negative result does not preclude  SARS-Cov-2 infection and should not be used as the sole basis for treatment or other patient management decisions. A negative result may occur with  improper specimen collection/handling, submission of specimen other than nasopharyngeal swab, presence of viral mutation(s) within the areas targeted by this assay, and inadequate number of viral copies(<138 copies/mL). A negative result must be combined with clinical observations, patient history, and epidemiological information. The expected result is Negative.  Fact Sheet for Patients:  BloggerCourse.com  Fact Sheet for Healthcare Providers:  SeriousBroker.it  This test is no t yet approved or cleared by the [place]  FDA and  has been authorized for detection and/or diagnosis of SARS-CoV-2 by FDA under an Emergency Use Authorization (EUA). This EUA will remain  in effect (meaning this test can be used) for the duration of the COVID-19 declaration under Section 564(b)(1) of the Act, 21 U.S.C.section 360bbb-3(b)(1), unless the authorization is terminated  or revoked sooner.       Influenza A by PCR NEGATIVE NEGATIVE Final   Influenza B by PCR NEGATIVE NEGATIVE Final    Comment: (NOTE) The Xpert Xpress SARS-CoV-2/FLU/RSV plus assay is intended as an aid in the diagnosis of influenza from Nasopharyngeal swab specimens and should not be used as a sole basis for treatment. Nasal washings and aspirates are unacceptable for Xpert Xpress SARS-CoV-2/FLU/RSV testing.  Fact Sheet for Patients: BloggerCourse.com  Fact Sheet for Healthcare Providers: SeriousBroker.it  This test is not yet approved or cleared by the [place] FDA and has been authorized for detection and/or diagnosis of SARS-CoV-2 by FDA under an Emergency Use Authorization (EUA). This EUA will remain in effect (meaning this test can be used) for the duration of  the COVID-19 declaration under Section 564(b)(1) of the Act, 21 U.S.C. section 360bbb-3(b)(1), unless the authorization is terminated or revoked.  Performed at [facility], [address], [place], [place] [address]          Radiology Studies: No results found.      Scheduled Meds:  amLODipine  10 mg Oral Daily   enoxaparin (LOVENOX) injection  40 mg Subcutaneous Q24H   insulin aspart  0-15 Units Subcutaneous TID WC   insulin glargine-yfgn  10 Units Subcutaneous BID   ketorolac  1 drop Both Eyes QID   lactulose  20 g Oral Q6H   lisinopril  20 mg Oral BID   pantoprazole  40 mg Oral Daily   polyethylene glycol  17 g Oral Daily   prednisoLONE acetate  1 drop Both Eyes TID   Continuous Infusions:   LOS: 2 days   Time spent= 35 mins    Jaeceon Michelin Joline Maxcy, MD [facility]  If 7PM-7AM, please contact night-coverage  08/24/2021, 12:08 PM

## 2021-08-24 NOTE — Assessment & Plan Note (Addendum)
MRI shows lumbar disc herniation without any compressive symptoms.  He has been started on Decadron for 4 days.  Advised to follow-up outpatient with neurosurgery especially if his pain continues.

## 2021-08-24 NOTE — Progress Notes (Signed)
[facility]  ROUNDING NOTE   Subjective:   We were asked to see the patient for evaluation management of hyponatremia.  It was felt that the patient's hyponatremia was due to excessive free water intake.  Sodium remains low.  Today's hemoglobin 129.  Given another dose of tolvaptan today.  CT chest with no acute findings.   Patient was seen and examined.  Denies any complaints.  Objective:  Vital signs in last 24 hours:  Temp:  [97.2 F (36.2 C)-97.9 F (36.6 C)] 97.9 F (36.6 C) (02/19 0801) Pulse Rate:  [80-91] 84 (02/19 0801) Resp:  [18-20] 18 (02/19 0801) BP: (147-168)/(69-80) 168/76 (02/19 0801) SpO2:  [100 %] 100 % (02/19 0801)  Weight change:  Filed Weights   08/22/21 0430  Weight: 61.2 kg    Intake/Output:   Intake/Output this shift:  No intake/output data recorded.  Physical Exam: General: No acute distress  Head: Normocephalic, atraumatic. Moist oral mucosal membranes  Eyes: Anicteric  Neck: Supple  Lungs:  Clear to auscultation, normal effort  Heart: S1S2 no rubs  Abdomen:  Soft, nontender, bowel sounds present  Extremities: Lower extremity edema  Neurologic: Awake, alert, following commands  Skin: No acute rash  Access: No access    Basic Metabolic Panel: Recent Labs  Lab 08/22/21 0441 08/22/21 0600 08/22/21 2127 08/23/21 0534 08/23/21 1343 08/23/21 1951 08/24/21 0504 08/24/21 1304  NA 122* 123*   < > 136 131* 131* 128* 129*  K 5.1 4.9  --  4.4  --   --  4.7  --   CL 91* 93*  --  107  --   --  99  --   CO2 27 24  --  24  --   --  23  --   GLUCOSE 107* 101*  --  108*  --   --  152*  --   BUN 22* 21*  --  20  --   --  23*  --   CREATININE 0.81 0.91  --  0.79  --   --  0.78  --   CALCIUM 8.9 8.8*  --  8.7*  --   --  8.4*  --   MG  --   --   --   --   --   --  1.9  --    < > = values in this interval not displayed.     Liver Function Tests: Recent Labs  Lab 08/22/21 0441 08/22/21 1504  AST 18 18  ALT 19 21  ALKPHOS 65 71   BILITOT 0.7 0.6  PROT 5.9* 5.5*  ALBUMIN 2.8* 2.6*    Recent Labs  Lab 08/22/21 0441  LIPASE 33    No results for input(s): AMMONIA in the last 168 hours.  CBC: Recent Labs  Lab 08/22/21 0441 08/23/21 0534 08/24/21 0504  WBC 5.8 5.1 5.5  NEUTROABS 2.6  --   --   HGB 13.0 12.1* 11.3*  HCT 35.2* 34.1* 32.1*  MCV 83.4 84.6 84.7  PLT 368 368 363     Cardiac Enzymes: No results for input(s): CKTOTAL, CKMB, CKMBINDEX, TROPONINI in the last 168 hours.  BNP: Invalid input(s): POCBNP  CBG: Recent Labs  Lab 08/23/21 1147 08/23/21 1554 08/23/21 2135 08/24/21 0809 08/24/21 1252  GLUCAP 287* 182* 152* 140* 145*     Microbiology: Results for orders placed or performed during the hospital encounter of 08/22/21  Resp Panel by RT-PCR (Flu A&B, Covid) Nasopharyngeal Swab  Status: None   Collection Time: 08/22/21  6:00 AM   Specimen: Nasopharyngeal Swab; Nasopharyngeal(NP) swabs in vial transport medium  Result Value Ref Range Status   SARS Coronavirus 2 by RT PCR NEGATIVE NEGATIVE Final    Comment: (NOTE) SARS-CoV-2 target nucleic acids are NOT DETECTED.  The SARS-CoV-2 RNA is generally detectable in upper respiratory specimens during the acute phase of infection. The lowest concentration of SARS-CoV-2 viral copies this assay can detect is 138 copies/mL. A negative result does not preclude SARS-Cov-2 infection and should not be used as the sole basis for treatment or other patient management decisions. A negative result may occur with  improper specimen collection/handling, submission of specimen other than nasopharyngeal swab, presence of viral mutation(s) within the areas targeted by this assay, and inadequate number of viral copies(<138 copies/mL). A negative result must be combined with clinical observations, patient history, and epidemiological information. The expected result is Negative.  Fact Sheet for Patients:   EntrepreneurPulse.com.au  Fact Sheet for Healthcare Providers:  IncredibleEmployment.be  This test is no t yet approved or cleared by the [place] FDA and  has been authorized for detection and/or diagnosis of SARS-CoV-2 by FDA under an Emergency Use Authorization (EUA). This EUA will remain  in effect (meaning this test can be used) for the duration of the COVID-19 declaration under Section 564(b)(1) of the Act, 21 U.S.C.section 360bbb-3(b)(1), unless the authorization is terminated  or revoked sooner.       Influenza A by PCR NEGATIVE NEGATIVE Final   Influenza B by PCR NEGATIVE NEGATIVE Final    Comment: (NOTE) The Xpert Xpress SARS-CoV-2/FLU/RSV plus assay is intended as an aid in the diagnosis of influenza from Nasopharyngeal swab specimens and should not be used as a sole basis for treatment. Nasal washings and aspirates are unacceptable for Xpert Xpress SARS-CoV-2/FLU/RSV testing.  Fact Sheet for Patients: EntrepreneurPulse.com.au  Fact Sheet for Healthcare Providers: IncredibleEmployment.be  This test is not yet approved or cleared by the [place] FDA and has been authorized for detection and/or diagnosis of SARS-CoV-2 by FDA under an Emergency Use Authorization (EUA). This EUA will remain in effect (meaning this test can be used) for the duration of the COVID-19 declaration under Section 564(b)(1) of the Act, 21 U.S.C. section 360bbb-3(b)(1), unless the authorization is terminated or revoked.  Performed at [facility], [address] [place], [place] [address]     Coagulation Studies: No results for input(s): LABPROT, INR in the last 72 hours.  Urinalysis: Recent Labs    08/22/21 0741  COLORURINE COLORLESS*  LABSPEC 1.011  PHURINE 7.0  GLUCOSEU NEGATIVE  HGBUR NEGATIVE  BILIRUBINUR NEGATIVE  KETONESUR NEGATIVE  PROTEINUR 30*  NITRITE NEGATIVE  LEUKOCYTESUR  NEGATIVE       Imaging: DG Abd 1 View  Result Date: 08/24/2021 CLINICAL DATA:  Abdominal distention. EXAM: ABDOMEN - 1 VIEW COMPARISON:  CT scan 08/22/2021 FINDINGS: Diffuse gaseous distention of the colon evident. No substantial small bowel gaseous dilatation. Visualized bony anatomy unremarkable. IMPRESSION: Diffuse gaseous distention of the colon. Component of ileus not excluded. Electronically Signed   By: Misty Stanley M.D.   On: 08/24/2021 12:10   CT CHEST W CONTRAST  Result Date: 08/24/2021 CLINICAL DATA:  Hyponatremia, evaluate for underlying lung mass EXAM: CT CHEST WITH CONTRAST TECHNIQUE: Multidetector CT imaging of the chest was performed during intravenous contrast administration. RADIATION DOSE REDUCTION: This exam was performed according to the departmental dose-optimization program which includes automated exposure control, adjustment of the mA  and/or kV according to patient size and/or use of iterative reconstruction technique. CONTRAST:  41mL OMNIPAQUE IOHEXOL 300 MG/ML  SOLN COMPARISON:  None. FINDINGS: Cardiovascular: Aortic atherosclerosis. Normal heart size. Three-vessel coronary artery calcifications. No pericardial effusion. Mediastinum/Nodes: No enlarged mediastinal, hilar, or axillary lymph nodes. Thyroid gland, trachea, and esophagus demonstrate no significant findings. Lungs/Pleura: Minimal dependent bibasilar partial atelectasis or scarring. Occasional small pulmonary nodules for example in the dependent left lung base measuring 0.4 cm (series 3, image 94) no pleural effusion or pneumothorax. Upper Abdomen: No acute abnormality. Musculoskeletal: No chest wall abnormality. No suspicious osseous lesions identified. IMPRESSION: 1. No evidence of mass or lymphadenopathy in the chest. 2. Occasional small pulmonary nodules, measuring no greater than 0.4 cm. No follow-up needed if patient is low-risk (and has no known or suspected primary neoplasm). Non-contrast chest CT can be  considered in 12 months if patient is high-risk. This recommendation follows the consensus statement: Guidelines for Management of Incidental Pulmonary Nodules Detected on CT Images: From the Fleischner Society 2017; Radiology 2017; 284:228-243. 3. Coronary artery disease. Aortic Atherosclerosis (ICD10-I70.0). Electronically Signed   By: Delanna Ahmadi M.D.   On: 08/24/2021 12:40   MR Lumbar Spine W Wo Contrast  Result Date: 08/24/2021 CLINICAL DATA:  56 year old male with low back pain, possible infection. EXAM: MRI LUMBAR SPINE WITHOUT AND WITH CONTRAST TECHNIQUE: Multiplanar and multiecho pulse sequences of the lumbar spine were obtained without and with intravenous contrast. CONTRAST:  65mL GADAVIST GADOBUTROL 1 MMOL/ML IV SOLN COMPARISON:  CT Abdomen and Pelvis 08/22/2021. FINDINGS: Segmentation:  Normal on the comparison. Alignment:  Straightening of lumbar lordosis, otherwise normal. Vertebrae: No marrow edema or evidence of acute osseous abnormality. Visualized bone marrow signal is within normal limits. Intact visible sacrum. Conus medullaris and cauda equina: Conus extends to the T12-L1 level. No lower spinal cord or conus signal abnormality. No abnormal intradural enhancement or dural thickening. Normal cauda equina nerve roots. Paraspinal and other soft tissues: Negative. Disc levels: T11-T12: Negative. T12-L1:  Negative. L1-L2:  Negative. L2-L3:  Negative. L3-L4: Mild disc desiccation and circumferential disc bulge. No spinal or lateral recess stenosis. Borderline to mild bilateral L3 foraminal stenosis. L4-L5: Subtle left far lateral disc protrusion with annular fissure and enhancement (series 8, image 28). But otherwise negative. No spinal stenosis or foraminal involvement. L5-S1:  Subtle circumferential disc bulging. Otherwise negative. IMPRESSION: 1. No evidence of infection and largely normal for age MRI appearance of the lumbar spine. 2. 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 3. Minimal lumbar spine degeneration otherwise. Borderline to mild bilateral L3 neural foraminal stenosis. Electronically Signed   By: Genevie Ann M.D.   On: 08/24/2021 12:08     Medications:      amLODipine  10 mg Oral Daily   enoxaparin (LOVENOX) injection  40 mg Subcutaneous Q24H   insulin aspart  0-15 Units Subcutaneous TID WC   insulin glargine-yfgn  10 Units Subcutaneous BID   ketorolac  1 drop Both Eyes QID   lactulose  20 g Oral Q6H   lisinopril  20 mg Oral BID   pantoprazole  40 mg Oral Daily   polyethylene glycol  17 g Oral Daily   prednisoLONE acetate  1 drop Both Eyes TID   acetaminophen **OR** acetaminophen, guaiFENesin, hydrALAZINE, ipratropium-albuterol, metoprolol tartrate, ondansetron **OR** ondansetron (ZOFRAN) IV, senna-docusate, traZODone  Assessment/ Plan:  56 y.o. male  with previous medical conditions including diabetes and hypertension, who was  admitted to [facility] on 08/22/2021 for Hyponatremia [E87.1]  Hyponatremia likely due to excessive fluid intake.  Status post low-dose tolvaptan x2.  Sodium level again started to trend down with today's sodium level 129.  Status post CT chest with no acute findings.  It is felt that hyponatremia could be also from elevated TSH.  Will discuss with primary care team.   Diabetes mellitus.  Last A1c 7.5. Continue current regimen including Lantus monitor blood sugar.  Hypertension continue home regimen with amlodipine and lisinopril.  Monitor blood pressure closely.     LOS: 2 Kalden Wanke P Synda Bagent 2/19/20233:44 PM

## 2021-08-24 NOTE — Progress Notes (Signed)
Used interpreter for assessment that was done at 9:00, all medication reviewed with the interpreter and all questions answered.

## 2021-08-25 ENCOUNTER — Inpatient Hospital Stay: Payer: Self-pay

## 2021-08-25 LAB — CBC
HCT: 30.1 % — ABNORMAL LOW (ref 39.0–52.0)
Hemoglobin: 10.7 g/dL — ABNORMAL LOW (ref 13.0–17.0)
MCH: 30.1 pg (ref 26.0–34.0)
MCHC: 35.5 g/dL (ref 30.0–36.0)
MCV: 84.8 fL (ref 80.0–100.0)
Platelets: 317 10*3/uL (ref 150–400)
RBC: 3.55 MIL/uL — ABNORMAL LOW (ref 4.22–5.81)
RDW: 12.9 % (ref 11.5–15.5)
WBC: 5.7 10*3/uL (ref 4.0–10.5)
nRBC: 0 % (ref 0.0–0.2)

## 2021-08-25 LAB — BASIC METABOLIC PANEL
Anion gap: 4 — ABNORMAL LOW (ref 5–15)
BUN: 22 mg/dL — ABNORMAL HIGH (ref 6–20)
CO2: 24 mmol/L (ref 22–32)
Calcium: 8.5 mg/dL — ABNORMAL LOW (ref 8.9–10.3)
Chloride: 102 mmol/L (ref 98–111)
Creatinine, Ser: 0.94 mg/dL (ref 0.61–1.24)
GFR, Estimated: >60 mL/min (ref >60)
Glucose, Bld: 131 mg/dL — ABNORMAL HIGH (ref 70–99)
Potassium: 4.5 mmol/L (ref 3.5–5.1)
Sodium: 130 mmol/L — ABNORMAL LOW (ref 135–145)

## 2021-08-25 LAB — CORTISOL: Cortisol, Plasma: 13.5 ug/dL

## 2021-08-25 LAB — GLUCOSE, CAPILLARY
Glucose-Capillary: 138 mg/dL — ABNORMAL HIGH (ref 70–99)
Glucose-Capillary: 209 mg/dL — ABNORMAL HIGH (ref 70–99)
Glucose-Capillary: 117 mg/dL — ABNORMAL HIGH (ref 70–99)
Glucose-Capillary: 304 mg/dL — ABNORMAL HIGH (ref 70–99)

## 2021-08-25 LAB — T4: T4, Total: 8 ug/dL (ref 4.5–12.0)

## 2021-08-25 LAB — MAGNESIUM: Magnesium: 1.8 mg/dL (ref 1.7–2.4)

## 2021-08-25 IMAGING — DX DG ABDOMEN 1V
2 series · 2 of 2 positions shown · non-contrast
Comparison: [DATE]

CLINICAL DATA: Abdominal distension reported on previous imaging.

EXAM:
ABDOMEN - 1 VIEW

[abdomen supine (1 of 2)]
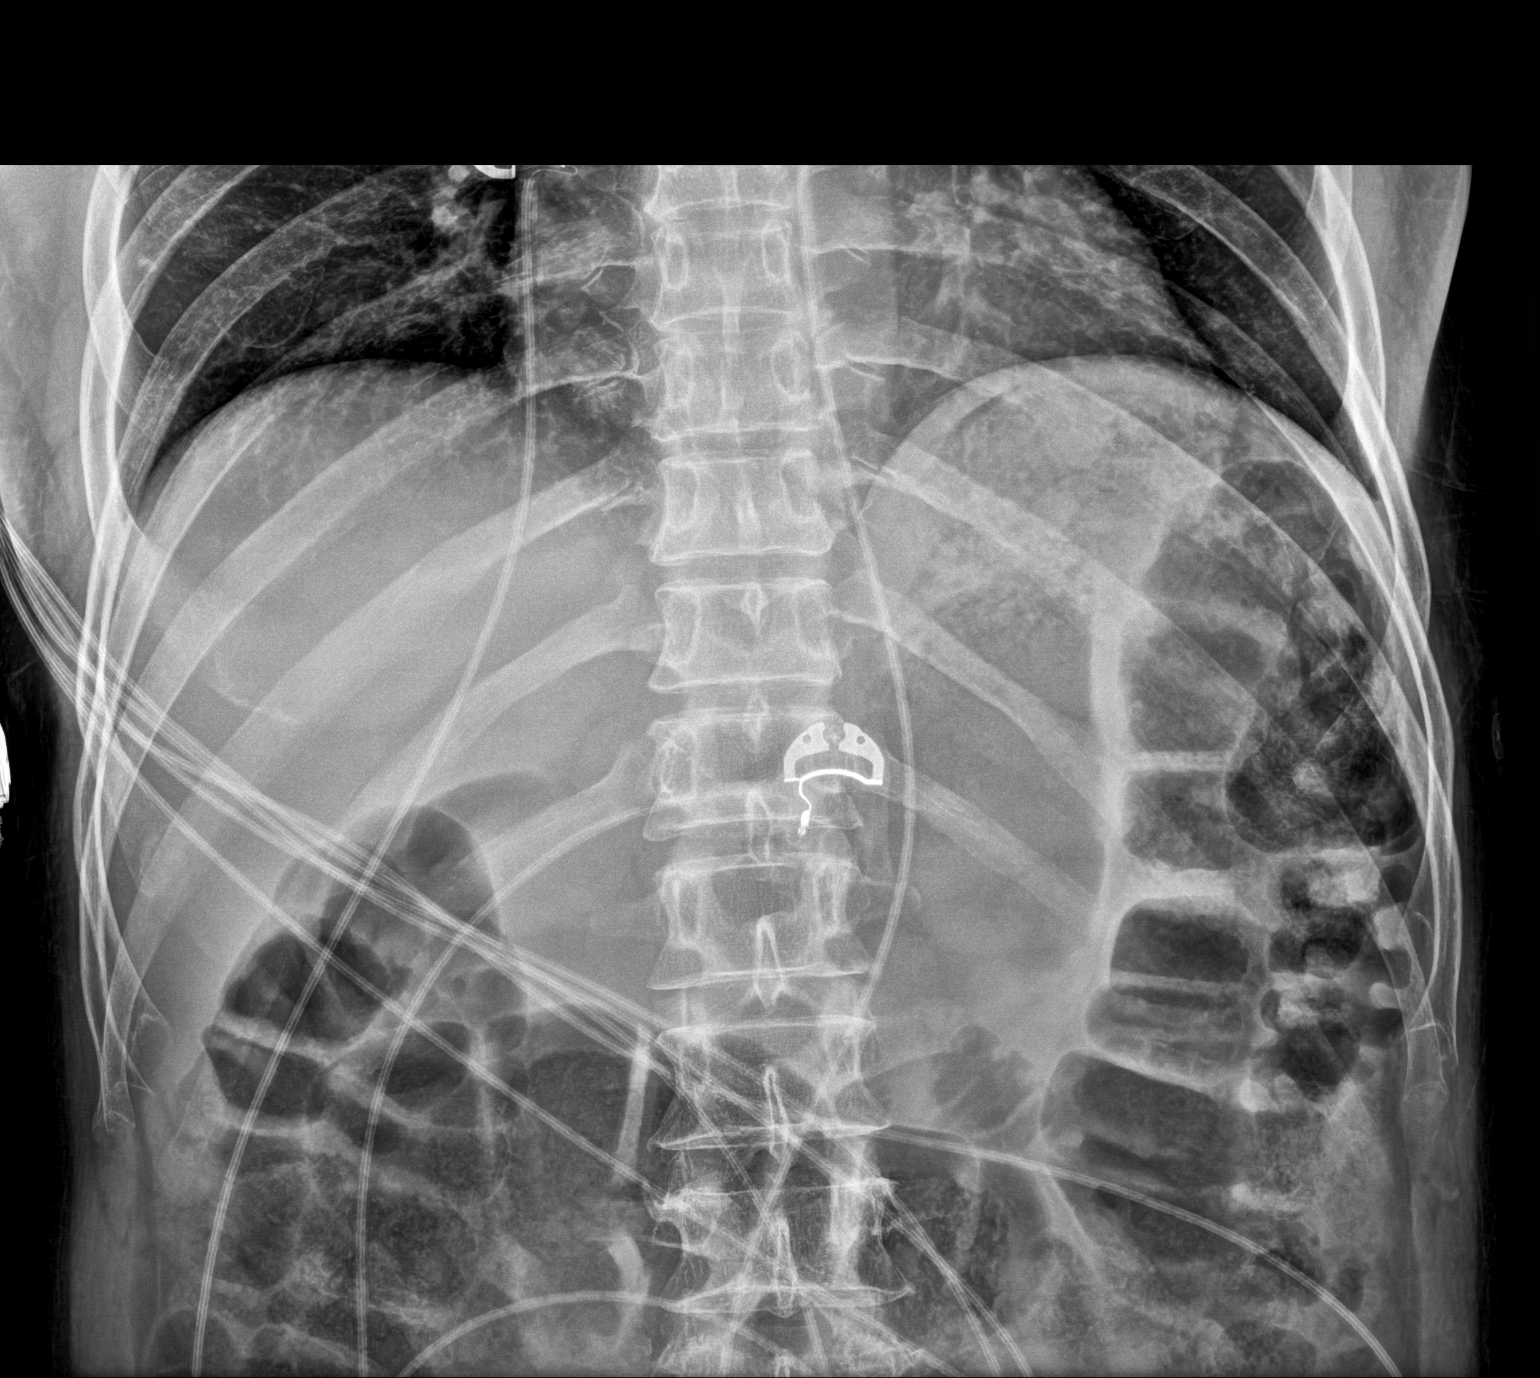

[abdomen supine (2 of 2)]
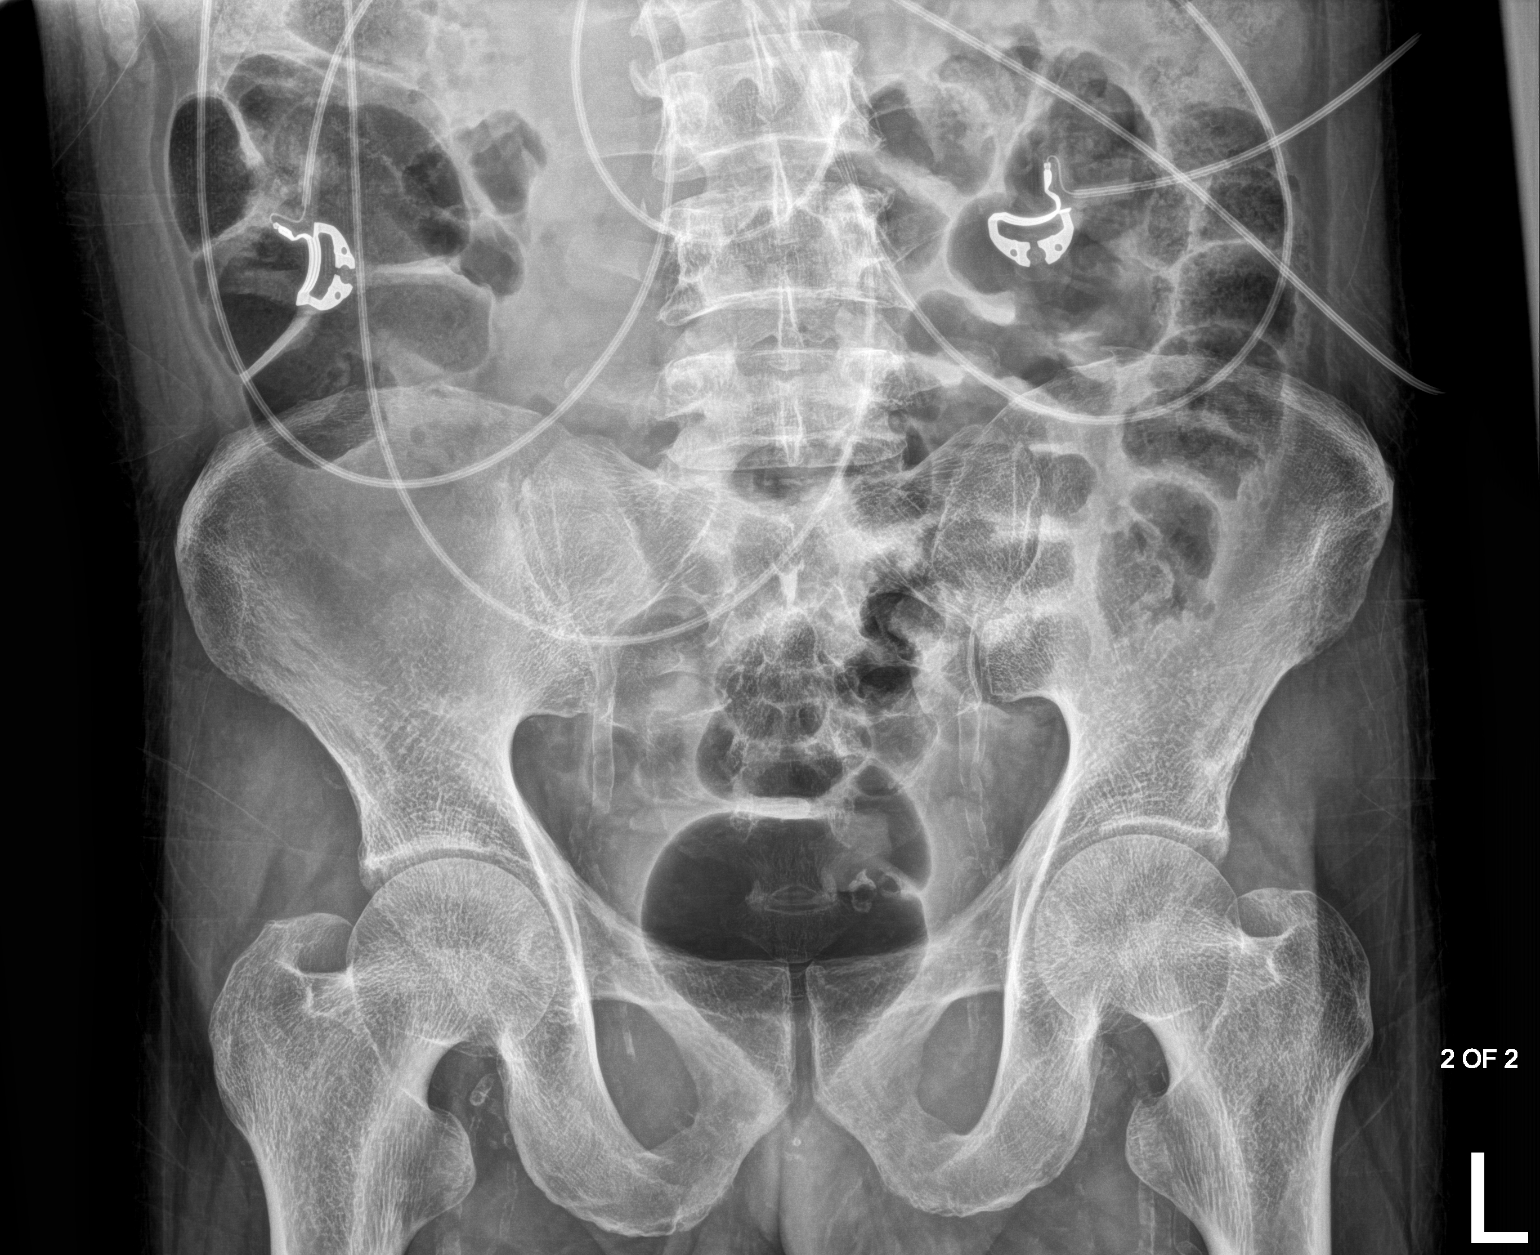

[2 of 2 positions shown; findings below may reference images not displayed]

FINDINGS: EKG leads project over the lower chest and abdomen.

No support tubes are demonstrated.

Mild gaseous distension of the stomach.

Stool and gas throughout the colon to the level of the rectum. Mild
colonic distension without change gaseous distension throughout with
scattered areas of stool. No small bowel dilation. No abnormal
calcifications.

On limited assessment there is no acute skeletal finding. Soft
tissues are grossly unremarkable.
IMPRESSION: 1. Mild gaseous distension of the colon persists with gas to the
level of the rectum. Findings could reflect a component of mild
ileus. Overall findings are not substantially changed compared to
previous imaging.
2. No support tubes visualized.

## 2021-08-25 MED ORDER — SODIUM CHLORIDE 1 G PO TABS
1.0000 g | ORAL_TABLET | Freq: Three times a day (TID) | ORAL | Status: DC
  Administered 2021-08-25 – 2021-08-26 (×3): 1 g via ORAL
  Filled 2021-08-25 (×3): qty 1

## 2021-08-25 MED ORDER — LEVOTHYROXINE SODIUM 50 MCG PO TABS
50.0000 ug | ORAL_TABLET | Freq: Every day | ORAL | Status: DC
  Administered 2021-08-25 – 2021-08-26 (×2): 50 ug via ORAL
  Filled 2021-08-25 (×2): qty 1

## 2021-08-25 MED ORDER — DEXAMETHASONE 4 MG PO TABS
4.0000 mg | ORAL_TABLET | Freq: Three times a day (TID) | ORAL | Status: AC
  Administered 2021-08-25 – 2021-08-26 (×3): 4 mg via ORAL
  Filled 2021-08-25 (×3): qty 1

## 2021-08-25 MED ORDER — POLYETHYLENE GLYCOL 3350 17 G PO PACK
17.0000 g | PACK | Freq: Two times a day (BID) | ORAL | Status: DC
  Administered 2021-08-26: 17 g via ORAL
  Filled 2021-08-25: qty 1

## 2021-08-25 MED ORDER — INSULIN ASPART 100 UNIT/ML IJ SOLN
8.0000 [IU] | Freq: Once | INTRAMUSCULAR | Status: AC
  Administered 2021-08-25: 8 [IU] via SUBCUTANEOUS
  Filled 2021-08-25: qty 1

## 2021-08-25 NOTE — Progress Notes (Signed)
MEDICATION RELATED CONSULT NOTE - INITIAL   Pharmacy Consult for Tolvaptan Indication: hyponatremia  No Known Allergies  Patient Measurements: Height: 5\' 2"  (157.5 cm) Weight: 61.2 kg (135 lb) IBW/kg (Calculated) : 54.6  Vital Signs: Temp: 98.1 F (36.7 C) (02/20 0808) Temp Source: Oral (02/20 0808) BP: 129/69 (02/20 0808) Pulse Rate: 85 (02/20 0808)  Intake/Output from previous day: Net I/O + 5L  Intake/Output from this shift: Total I/O In: 240 [P.O.:240] Out: -   Labs: Recent Labs    08/22/21 1504 08/23/21 0534 08/24/21 0504 08/25/21 0428  WBC  --  5.1 5.5 5.7  HGB  --  12.1* 11.3* 10.7*  HCT  --  34.1* 32.1* 30.1*  PLT  --  368 363 317  CREATININE  --  0.79 0.78 0.94  MG  --   --  1.9 1.8  ALBUMIN 2.6*  --   --   --   PROT 5.5*  --   --   --   AST 18  --   --   --   ALT 21  --   --   --   ALKPHOS 71  --   --   --   BILITOT 0.6  --   --   --   BILIDIR <0.1  --   --   --   IBILI NOT CALCULATED  --   --   --    Estimated Creatinine Clearance: 67 mL/min (by C-G formula based on SCr of 0.94 mg/dL).   Medical History: Past Medical History:  Diagnosis Date   Diabetes mellitus without complication (HCC)    Hypertension     Medications:  Scheduled:   amLODipine  10 mg Oral Daily   enoxaparin (LOVENOX) injection  40 mg Subcutaneous Q24H   insulin aspart  0-15 Units Subcutaneous TID WC   insulin glargine-yfgn  10 Units Subcutaneous BID   ketorolac  1 drop Both Eyes QID   lactulose  20 g Oral Q6H   levothyroxine  50 mcg Oral Q0600   lisinopril  20 mg Oral BID   pantoprazole  40 mg Oral Daily   polyethylene glycol  17 g Oral BID   prednisoLONE acetate  1 drop Both Eyes TID   sodium chloride  1 g Oral TID WC   Assessment: Patient admitted with abdominal pain. Noted history of diabetes and hypertension. Hyponatremia and constipation present. Nephrology consulted and tolvaptan therapy started. Pharmacy consulted for tolvaptan monitoring.  Tolvaptan 15mg   given 2/17 Tolvaptan 15mg  give 2/19  Desired rate of correction: should not exceed 8 mEq/L per 8 hours or 12 mEq/L in 24 hours. Discontinue if serum sodium anytime during the first 24 hours rises >12 mEq/L from baseline. Osmotic demyelination may occur if the rate of sodium correction exceeds 10-12 mEq/L in the first 24 hours or 18 mEq/L in the first 48 hours  Labs: 2/17 Sodium 122 2/18 Sodium 136 2/19 Sodium 130 2/20 Sodium 130  Plan:  Na started to trend down, but no additional tolvaptan dose recommended by nephology. Na stable since yesterday, will follow up daily with AM labs Primary Care team will manage newly diagnosed hypothyroidism and continue to monitor.  Mumtaz Lovins Rodriguez-Guzman PharmD, BCPS 08/25/2021 11:48 AM

## 2021-08-25 NOTE — Progress Notes (Signed)
PROGRESS NOTE    Alexander Kerr  [id number] DOB: [birth year] DOA: 08/22/2021 PCP: [facility] [name]   Brief Narrative:   56 year old with history of DM2, HTN admitted to the hospital for ongoing abdominal pain for 2 weeks.  Upon admission he was noted to be hyponatremic, had severe constipation.  He also has significant amount of atherosclerotic disease seen in multiple blood vessels on the CT scan.  Assessment & Plan:  Principal Problem:   Hyponatremia Active Problems:   Constipation   Low back pain   Diabetes mellitus without complication (HCC)   Hypertension   Elevated TSH     Assessment and Plan: * Hyponatremia- (present on admission) Initially patient was given tolvaptan which rapidly improved sodium thereafter was placed on D5 water.  Also appears to have stabilized at 130.  CT of the chest did not reveal any lung mass.  Low back pain MRI shows lumbar disc herniation without any compressive symptoms.  Constipation Severe constipation and ileus.  Does have gaseous distention in his colon.  Have advised him to ambulate as much as possible and change position in bed frequently.  Bowel regimen.    Elevated TSH T4 still pending, confirm that it is in the lab. We will go and start Synthroid today.  Hypertension- (present on admission) Continue home Norvasc, lisinopril.  Diabetes mellitus without complication ([place]) Continue patient's Lantus, sliding scale and Accu-Chek.          DVT prophylaxis: enoxaparin (LOVENOX) injection 40 mg Start: 08/22/21 1000 Code Status: Full  Family Communication:    Waiting for t4 results and Nephro clearance prior to dc   Subjective: Interpreter ID [id number] Feels ok, still has some lower abd pain and feeling some distention.    Review of Systems Otherwise negative except as per HPI, including: General: Denies fever, chills, night sweats or unintended weight loss. Resp: Denies cough, wheezing, shortness  of breath. Cardiac: Denies chest pain, palpitations, orthopnea, paroxysmal nocturnal dyspnea. GI: Denies abdominal pain, nausea, vomiting, diarrhea or constipation GU: Denies dysuria, frequency, hesitancy or incontinence MS: Denies muscle aches, joint pain or swelling Neuro: Denies headache, neurologic deficits (focal weakness, numbness, tingling), abnormal gait Psych: Denies anxiety, depression, SI/HI/AVH Skin: Denies new rashes or lesions ID: Denies sick contacts, exotic exposures, travel  Examination:  General exam: Appears calm and comfortable  Respiratory system: Clear to auscultation. Respiratory effort normal. Cardiovascular system: S1 & S2 heard, RRR. No JVD, murmurs, rubs, gallops or clicks. No pedal edema. Gastrointestinal system: Abdomen is nondistended, soft and nontender. No organomegaly or masses felt. Normal bowel sounds heard. Central nervous system: Alert and oriented. No focal neurological deficits. Extremities: Symmetric 5 x 5 power. Skin: No rashes, lesions or ulcers Psychiatry: Judgement and insight appear normal. Mood & affect appropriate.    Objective: Vitals:   08/24/21 1628 08/24/21 2053 08/25/21 0434 08/25/21 0808  BP: (!) 147/72 (!) 147/78 122/65 129/69  Pulse: 86 89 85 85  Resp: 18 16 14 18   Temp: 98.6 F (37 C) 98.4 F (36.9 C) 98.2 F (36.8 C) 98.1 F (36.7 C)  TempSrc: Oral Oral Oral Oral  SpO2: 100% 100% 98% 98%  Weight:      Height:        Intake/Output Summary (Last 24 hours) at 08/25/2021 1051 Last data filed at 08/25/2021 1013 Gross per 24 hour  Intake 240 ml  Output --  Net 240 ml   Filed Weights   08/22/21 0430  Weight: 61.2 kg  Data Reviewed:   CBC: Recent Labs  Lab 08/22/21 0441 08/23/21 0534 08/24/21 0504 08/25/21 0428  WBC 5.8 5.1 5.5 5.7  NEUTROABS 2.6  --   --   --   HGB 13.0 12.1* 11.3* 10.7*  HCT 35.2* 34.1* 32.1* 30.1*  MCV 83.4 84.6 84.7 84.8  PLT 368 368 363 [id number]   Basic Metabolic Panel: Recent Labs   Lab 08/22/21 0441 08/22/21 0600 08/22/21 2127 08/23/21 0534 08/23/21 1343 08/23/21 1951 08/24/21 0504 08/24/21 1304 08/24/21 2029 08/25/21 0428  NA 122* 123*   < > 136   < > 131* 128* 129* 130* 130*  K 5.1 4.9  --  4.4  --   --  4.7  --   --  4.5  CL 91* 93*  --  107  --   --  99  --   --  102  CO2 27 24  --  24  --   --  23  --   --  24  GLUCOSE 107* 101*  --  108*  --   --  152*  --   --  131*  BUN 22* 21*  --  20  --   --  23*  --   --  22*  CREATININE 0.81 0.91  --  0.79  --   --  0.78  --   --  0.94  CALCIUM 8.9 8.8*  --  8.7*  --   --  8.4*  --   --  8.5*  MG  --   --   --   --   --   --  1.9  --   --  1.8   < > = values in this interval not displayed.   GFR: Estimated Creatinine Clearance: 67 mL/min (by C-G formula based on SCr of 0.94 mg/dL). Liver Function Tests: Recent Labs  Lab 08/22/21 0441 08/22/21 1504  AST 18 18  ALT 19 21  ALKPHOS 65 71  BILITOT 0.7 0.6  PROT 5.9* 5.5*  ALBUMIN 2.8* 2.6*   Recent Labs  Lab 08/22/21 0441  LIPASE 33   No results for input(s): AMMONIA in the last 168 hours. Coagulation Profile: No results for input(s): INR, PROTIME in the last 168 hours. Cardiac Enzymes: No results for input(s): CKTOTAL, CKMB, CKMBINDEX, TROPONINI in the last 168 hours. BNP (last 3 results) No results for input(s): PROBNP in the last 8760 hours. HbA1C: No results for input(s): HGBA1C in the last 72 hours. CBG: Recent Labs  Lab 08/24/21 0809 08/24/21 1252 08/24/21 1653 08/24/21 2054 08/25/21 0803  GLUCAP 140* 145* 231* 154* 138*   Lipid Profile: No results for input(s): CHOL, HDL, LDLCALC, TRIG, CHOLHDL, LDLDIRECT in the last 72 hours. Thyroid Function Tests: No results for input(s): TSH, T4TOTAL, FREET4, T3FREE, THYROIDAB in the last 72 hours. Anemia Panel: No results for input(s): VITAMINB12, FOLATE, FERRITIN, TIBC, IRON, RETICCTPCT in the last 72 hours. Sepsis Labs: No results for input(s): PROCALCITON, LATICACIDVEN in the last 168  hours.  Recent Results (from the past 240 hour(s))  Resp Panel by RT-PCR (Flu A&B, Covid) Nasopharyngeal Swab     Status: None   Collection Time: 08/22/21  6:00 AM   Specimen: Nasopharyngeal Swab; Nasopharyngeal(NP) swabs in vial transport medium  Result Value Ref Range Status   SARS Coronavirus 2 by RT PCR NEGATIVE NEGATIVE Final    Comment: (NOTE) SARS-CoV-2 target nucleic acids are NOT DETECTED.  The SARS-CoV-2 RNA is generally detectable in upper respiratory specimens  during the acute phase of infection. The lowest concentration of SARS-CoV-2 viral copies this assay can detect is 138 copies/mL. A negative result does not preclude SARS-Cov-2 infection and should not be used as the sole basis for treatment or other patient management decisions. A negative result may occur with  improper specimen collection/handling, submission of specimen other than nasopharyngeal swab, presence of viral mutation(s) within the areas targeted by this assay, and inadequate number of viral copies(<138 copies/mL). A negative result must be combined with clinical observations, patient history, and epidemiological information. The expected result is Negative.  Fact Sheet for Patients:  EntrepreneurPulse.com.au  Fact Sheet for Healthcare Providers:  IncredibleEmployment.be  This test is no t yet approved or cleared by the [place] FDA and  has been authorized for detection and/or diagnosis of SARS-CoV-2 by FDA under an Emergency Use Authorization (EUA). This EUA will remain  in effect (meaning this test can be used) for the duration of the COVID-19 declaration under Section 564(b)(1) of the Act, 21 U.S.C.section 360bbb-3(b)(1), unless the authorization is terminated  or revoked sooner.       Influenza A by PCR NEGATIVE NEGATIVE Final   Influenza B by PCR NEGATIVE NEGATIVE Final    Comment: (NOTE) The Xpert Xpress SARS-CoV-2/FLU/RSV plus assay is intended  as an aid in the diagnosis of influenza from Nasopharyngeal swab specimens and should not be used as a sole basis for treatment. Nasal washings and aspirates are unacceptable for Xpert Xpress SARS-CoV-2/FLU/RSV testing.  Fact Sheet for Patients: EntrepreneurPulse.com.au  Fact Sheet for Healthcare Providers: IncredibleEmployment.be  This test is not yet approved or cleared by the [place] FDA and has been authorized for detection and/or diagnosis of SARS-CoV-2 by FDA under an Emergency Use Authorization (EUA). This EUA will remain in effect (meaning this test can be used) for the duration of the COVID-19 declaration under Section 564(b)(1) of the Act, 21 U.S.C. section 360bbb-3(b)(1), unless the authorization is terminated or revoked.  Performed at [facility], [address], [place], [place] [address]          Radiology Studies: DG Abd 1 View  Result Date: 08/25/2021 CLINICAL DATA:  Abdominal distension reported on previous imaging. EXAM: ABDOMEN - 1 VIEW COMPARISON:  August 24, 2021 FINDINGS: EKG leads project over the lower chest and abdomen. No support tubes are demonstrated. Mild gaseous distension of the stomach. Stool and gas throughout the colon to the level of the rectum. Mild colonic distension without change gaseous distension throughout with scattered areas of stool. No small bowel dilation. No abnormal calcifications. On limited assessment there is no acute skeletal finding. Soft tissues are grossly unremarkable. IMPRESSION: 1. Mild gaseous distension of the colon persists with gas to the level of the rectum. Findings could reflect a component of mild ileus. Overall findings are not substantially changed compared to previous imaging. 2. No support tubes visualized. Electronically Signed   By: Zetta Bills M.D.   On: 08/25/2021 09:50   DG Abd 1 View  Result Date: 08/24/2021 CLINICAL DATA:  Abdominal distention.  EXAM: ABDOMEN - 1 VIEW COMPARISON:  CT scan 08/22/2021 FINDINGS: Diffuse gaseous distention of the colon evident. No substantial small bowel gaseous dilatation. Visualized bony anatomy unremarkable. IMPRESSION: Diffuse gaseous distention of the colon. Component of ileus not excluded. Electronically Signed   By: Misty Stanley M.D.   On: 08/24/2021 12:10   CT CHEST W CONTRAST  Result Date: 08/24/2021 CLINICAL DATA:  Hyponatremia, evaluate for underlying lung mass EXAM: CT CHEST  WITH CONTRAST TECHNIQUE: Multidetector CT imaging of the chest was performed during intravenous contrast administration. RADIATION DOSE REDUCTION: This exam was performed according to the departmental dose-optimization program which includes automated exposure control, adjustment of the mA and/or kV according to patient size and/or use of iterative reconstruction technique. CONTRAST:  62mL OMNIPAQUE IOHEXOL 300 MG/ML  SOLN COMPARISON:  None. FINDINGS: Cardiovascular: Aortic atherosclerosis. Normal heart size. Three-vessel coronary artery calcifications. No pericardial effusion. Mediastinum/Nodes: No enlarged mediastinal, hilar, or axillary lymph nodes. Thyroid gland, trachea, and esophagus demonstrate no significant findings. Lungs/Pleura: Minimal dependent bibasilar partial atelectasis or scarring. Occasional small pulmonary nodules for example in the dependent left lung base measuring 0.4 cm (series 3, image 94) no pleural effusion or pneumothorax. Upper Abdomen: No acute abnormality. Musculoskeletal: No chest wall abnormality. No suspicious osseous lesions identified. IMPRESSION: 1. No evidence of mass or lymphadenopathy in the chest. 2. Occasional small pulmonary nodules, measuring no greater than 0.4 cm. No follow-up needed if patient is low-risk (and has no known or suspected primary neoplasm). Non-contrast chest CT can be considered in 12 months if patient is high-risk. This recommendation follows the consensus statement: Guidelines  for Management of Incidental Pulmonary Nodules Detected on CT Images: From the Fleischner Society 2017; Radiology 2017; [id number]. 3. Coronary artery disease. Aortic Atherosclerosis (ICD10-I70.0). Electronically Signed   By: Delanna Ahmadi M.D.   On: 08/24/2021 12:40   MR Lumbar Spine W Wo Contrast  Result Date: 08/24/2021 CLINICAL DATA:  56 year old male with low back pain, possible infection. EXAM: MRI LUMBAR SPINE WITHOUT AND WITH CONTRAST TECHNIQUE: Multiplanar and multiecho pulse sequences of the lumbar spine were obtained without and with intravenous contrast. CONTRAST:  23mL GADAVIST GADOBUTROL 1 MMOL/ML IV SOLN COMPARISON:  CT Abdomen and Pelvis 08/22/2021. FINDINGS: Segmentation:  Normal on the comparison. Alignment:  Straightening of lumbar lordosis, otherwise normal. Vertebrae: No marrow edema or evidence of acute osseous abnormality. Visualized bone marrow signal is within normal limits. Intact visible sacrum. Conus medullaris and cauda equina: Conus extends to the T12-L1 level. No lower spinal cord or conus signal abnormality. No abnormal intradural enhancement or dural thickening. Normal cauda equina nerve roots. Paraspinal and other soft tissues: Negative. Disc levels: T11-T12: Negative. T12-L1:  Negative. L1-L2:  Negative. L2-L3:  Negative. L3-L4: Mild disc desiccation and circumferential disc bulge. No spinal or lateral recess stenosis. Borderline to mild bilateral L3 foraminal stenosis. L4-L5: Subtle left far lateral disc protrusion with annular fissure and enhancement (series 8, image 28). But otherwise negative. No spinal stenosis or foraminal involvement. L5-S1:  Subtle circumferential disc bulging. Otherwise negative. IMPRESSION: 1. No evidence of infection and largely normal for age MRI appearance of the lumbar spine. 2. 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 3. Minimal lumbar spine degeneration otherwise.  Borderline to mild bilateral L3 neural foraminal stenosis. Electronically Signed   By: Genevie Ann M.D.   On: 08/24/2021 12:08        Scheduled Meds:  amLODipine  10 mg Oral Daily   enoxaparin (LOVENOX) injection  40 mg Subcutaneous Q24H   insulin aspart  0-15 Units Subcutaneous TID WC   insulin glargine-yfgn  10 Units Subcutaneous BID   ketorolac  1 drop Both Eyes QID   lactulose  20 g Oral Q6H   levothyroxine  50 mcg Oral Q0600   lisinopril  20 mg Oral BID   pantoprazole  40 mg Oral Daily   polyethylene glycol  17 g Oral BID  prednisoLONE acetate  1 drop Both Eyes TID   Continuous Infusions:   LOS: 3 days   Time spent= 35 mins    Katja Blue Arsenio Loader, MD [facility]  If 7PM-7AM, please contact night-coverage  08/25/2021, 10:51 AM

## 2021-08-25 NOTE — Progress Notes (Signed)
[facility]  ROUNDING NOTE   Subjective:   Patient seen resting quietly Tolerating meals without nausea and vomiting Continues to complain of mild constipation  Received second dose of tolvaptan yesterday, no urine output recorded  Sodium 130 today  Objective:  Vital signs in last 24 hours:  Temp:  [98.1 F (36.7 C)-98.6 F (37 C)] 98.1 F (36.7 C) (02/20 0808) Pulse Rate:  [85-89] 85 (02/20 0808) Resp:  [14-18] 18 (02/20 0808) BP: (122-147)/(65-78) 129/69 (02/20 0808) SpO2:  [98 %-100 %] 98 % (02/20 0808)  Weight change:  Filed Weights   08/22/21 0430  Weight: 61.2 kg    Intake/Output:   Intake/Output this shift:  Total I/O In: 480 [P.O.:480] Out: -   Physical Exam: General: No acute distress  Head: Normocephalic, atraumatic. Moist oral mucosal membranes  Eyes: Anicteric  Lungs:  Clear to auscultation, normal effort  Heart: S1S2 no rubs  Abdomen:  Soft, nontender, bowel sounds present  Extremities: Trace peripheral edema  Neurologic: Awake, alert, following commands  Skin: No acute rash  Access: No access    Basic Metabolic Panel: Recent Labs  Lab 08/22/21 0441 08/22/21 0600 08/22/21 2127 08/23/21 0534 08/23/21 1343 08/23/21 1951 08/24/21 0504 08/24/21 1304 08/24/21 2029 08/25/21 0428  NA 122* 123*   < > 136   < > 131* 128* 129* 130* 130*  K 5.1 4.9  --  4.4  --   --  4.7  --   --  4.5  CL 91* 93*  --  107  --   --  99  --   --  102  CO2 27 24  --  24  --   --  23  --   --  24  GLUCOSE 107* 101*  --  108*  --   --  152*  --   --  131*  BUN 22* 21*  --  20  --   --  23*  --   --  22*  CREATININE 0.81 0.91  --  0.79  --   --  0.78  --   --  0.94  CALCIUM 8.9 8.8*  --  8.7*  --   --  8.4*  --   --  8.5*  MG  --   --   --   --   --   --  1.9  --   --  1.8   < > = values in this interval not displayed.     Liver Function Tests: Recent Labs  Lab 08/22/21 0441 08/22/21 1504  AST 18 18  ALT 19 21  ALKPHOS 65 71  BILITOT 0.7  0.6  PROT 5.9* 5.5*  ALBUMIN 2.8* 2.6*    Recent Labs  Lab 08/22/21 0441  LIPASE 33    No results for input(s): AMMONIA in the last 168 hours.  CBC: Recent Labs  Lab 08/22/21 0441 08/23/21 0534 08/24/21 0504 08/25/21 0428  WBC 5.8 5.1 5.5 5.7  NEUTROABS 2.6  --   --   --   HGB 13.0 12.1* 11.3* 10.7*  HCT 35.2* 34.1* 32.1* 30.1*  MCV 83.4 84.6 84.7 84.8  PLT 368 368 363 317     Cardiac Enzymes: No results for input(s): CKTOTAL, CKMB, CKMBINDEX, TROPONINI in the last 168 hours.  BNP: Invalid input(s): POCBNP  CBG: Recent Labs  Lab 08/24/21 1252 08/24/21 1653 08/24/21 2054 08/25/21 0803 08/25/21 1222  GLUCAP 145* 231* 154* 138* 209*     Microbiology: Results  for orders placed or performed during the hospital encounter of 08/22/21  Resp Panel by RT-PCR (Flu A&B, Covid) Nasopharyngeal Swab     Status: None   Collection Time: 08/22/21  6:00 AM   Specimen: Nasopharyngeal Swab; Nasopharyngeal(NP) swabs in vial transport medium  Result Value Ref Range Status   SARS Coronavirus 2 by RT PCR NEGATIVE NEGATIVE Final    Comment: (NOTE) SARS-CoV-2 target nucleic acids are NOT DETECTED.  The SARS-CoV-2 RNA is generally detectable in upper respiratory specimens during the acute phase of infection. The lowest concentration of SARS-CoV-2 viral copies this assay can detect is 138 copies/mL. A negative result does not preclude SARS-Cov-2 infection and should not be used as the sole basis for treatment or other patient management decisions. A negative result may occur with  improper specimen collection/handling, submission of specimen other than nasopharyngeal swab, presence of viral mutation(s) within the areas targeted by this assay, and inadequate number of viral copies(<138 copies/mL). A negative result must be combined with clinical observations, patient history, and epidemiological information. The expected result is Negative.  Fact Sheet for Patients:   EntrepreneurPulse.com.au  Fact Sheet for Healthcare Providers:  IncredibleEmployment.be  This test is no t yet approved or cleared by the [place] FDA and  has been authorized for detection and/or diagnosis of SARS-CoV-2 by FDA under an Emergency Use Authorization (EUA). This EUA will remain  in effect (meaning this test can be used) for the duration of the COVID-19 declaration under Section 564(b)(1) of the Act, 21 U.S.C.section 360bbb-3(b)(1), unless the authorization is terminated  or revoked sooner.       Influenza A by PCR NEGATIVE NEGATIVE Final   Influenza B by PCR NEGATIVE NEGATIVE Final    Comment: (NOTE) The Xpert Xpress SARS-CoV-2/FLU/RSV plus assay is intended as an aid in the diagnosis of influenza from Nasopharyngeal swab specimens and should not be used as a sole basis for treatment. Nasal washings and aspirates are unacceptable for Xpert Xpress SARS-CoV-2/FLU/RSV testing.  Fact Sheet for Patients: EntrepreneurPulse.com.au  Fact Sheet for Healthcare Providers: IncredibleEmployment.be  This test is not yet approved or cleared by the [place] FDA and has been authorized for detection and/or diagnosis of SARS-CoV-2 by FDA under an Emergency Use Authorization (EUA). This EUA will remain in effect (meaning this test can be used) for the duration of the COVID-19 declaration under Section 564(b)(1) of the Act, 21 U.S.C. section 360bbb-3(b)(1), unless the authorization is terminated or revoked.  Performed at [facility], [address] [place], [place] [address]     Coagulation Studies: No results for input(s): LABPROT, INR in the last 72 hours.  Urinalysis: No results for input(s): COLORURINE, LABSPEC, PHURINE, GLUCOSEU, HGBUR, BILIRUBINUR, KETONESUR, PROTEINUR, UROBILINOGEN, NITRITE, LEUKOCYTESUR in the last 72 hours.  Invalid input(s): APPERANCEUR      Imaging: DG Abd 1 View  Result Date: 08/25/2021 CLINICAL DATA:  Abdominal distension reported on previous imaging. EXAM: ABDOMEN - 1 VIEW COMPARISON:  August 24, 2021 FINDINGS: EKG leads project over the lower chest and abdomen. No support tubes are demonstrated. Mild gaseous distension of the stomach. Stool and gas throughout the colon to the level of the rectum. Mild colonic distension without change gaseous distension throughout with scattered areas of stool. No small bowel dilation. No abnormal calcifications. On limited assessment there is no acute skeletal finding. Soft tissues are grossly unremarkable. IMPRESSION: 1. Mild gaseous distension of the colon persists with gas to the level of the rectum. Findings could reflect a component  of mild ileus. Overall findings are not substantially changed compared to previous imaging. 2. No support tubes visualized. Electronically Signed   By: Zetta Bills M.D.   On: 08/25/2021 09:50   DG Abd 1 View  Result Date: 08/24/2021 CLINICAL DATA:  Abdominal distention. EXAM: ABDOMEN - 1 VIEW COMPARISON:  CT scan 08/22/2021 FINDINGS: Diffuse gaseous distention of the colon evident. No substantial small bowel gaseous dilatation. Visualized bony anatomy unremarkable. IMPRESSION: Diffuse gaseous distention of the colon. Component of ileus not excluded. Electronically Signed   By: Misty Stanley M.D.   On: 08/24/2021 12:10   CT CHEST W CONTRAST  Result Date: 08/24/2021 CLINICAL DATA:  Hyponatremia, evaluate for underlying lung mass EXAM: CT CHEST WITH CONTRAST TECHNIQUE: Multidetector CT imaging of the chest was performed during intravenous contrast administration. RADIATION DOSE REDUCTION: This exam was performed according to the departmental dose-optimization program which includes automated exposure control, adjustment of the mA and/or kV according to patient size and/or use of iterative reconstruction technique. CONTRAST:  68mL OMNIPAQUE IOHEXOL 300 MG/ML   SOLN COMPARISON:  None. FINDINGS: Cardiovascular: Aortic atherosclerosis. Normal heart size. Three-vessel coronary artery calcifications. No pericardial effusion. Mediastinum/Nodes: No enlarged mediastinal, hilar, or axillary lymph nodes. Thyroid gland, trachea, and esophagus demonstrate no significant findings. Lungs/Pleura: Minimal dependent bibasilar partial atelectasis or scarring. Occasional small pulmonary nodules for example in the dependent left lung base measuring 0.4 cm (series 3, image 94) no pleural effusion or pneumothorax. Upper Abdomen: No acute abnormality. Musculoskeletal: No chest wall abnormality. No suspicious osseous lesions identified. IMPRESSION: 1. No evidence of mass or lymphadenopathy in the chest. 2. Occasional small pulmonary nodules, measuring no greater than 0.4 cm. No follow-up needed if patient is low-risk (and has no known or suspected primary neoplasm). Non-contrast chest CT can be considered in 12 months if patient is high-risk. This recommendation follows the consensus statement: Guidelines for Management of Incidental Pulmonary Nodules Detected on CT Images: From the Fleischner Society 2017; Radiology 2017; 284:228-243. 3. Coronary artery disease. Aortic Atherosclerosis (ICD10-I70.0). Electronically Signed   By: Delanna Ahmadi M.D.   On: 08/24/2021 12:40   MR Lumbar Spine W Wo Contrast  Result Date: 08/24/2021 CLINICAL DATA:  56 year old male with low back pain, possible infection. EXAM: MRI LUMBAR SPINE WITHOUT AND WITH CONTRAST TECHNIQUE: Multiplanar and multiecho pulse sequences of the lumbar spine were obtained without and with intravenous contrast. CONTRAST:  28mL GADAVIST GADOBUTROL 1 MMOL/ML IV SOLN COMPARISON:  CT Abdomen and Pelvis 08/22/2021. FINDINGS: Segmentation:  Normal on the comparison. Alignment:  Straightening of lumbar lordosis, otherwise normal. Vertebrae: No marrow edema or evidence of acute osseous abnormality. Visualized bone marrow signal is within  normal limits. Intact visible sacrum. Conus medullaris and cauda equina: Conus extends to the T12-L1 level. No lower spinal cord or conus signal abnormality. No abnormal intradural enhancement or dural thickening. Normal cauda equina nerve roots. Paraspinal and other soft tissues: Negative. Disc levels: T11-T12: Negative. T12-L1:  Negative. L1-L2:  Negative. L2-L3:  Negative. L3-L4: Mild disc desiccation and circumferential disc bulge. No spinal or lateral recess stenosis. Borderline to mild bilateral L3 foraminal stenosis. L4-L5: Subtle left far lateral disc protrusion with annular fissure and enhancement (series 8, image 28). But otherwise negative. No spinal stenosis or foraminal involvement. L5-S1:  Subtle circumferential disc bulging. Otherwise negative. IMPRESSION: 1. No evidence of infection and largely normal for age MRI appearance of the lumbar spine. 2. 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  3. Minimal lumbar spine degeneration otherwise. Borderline to mild bilateral L3 neural foraminal stenosis. Electronically Signed   By: Genevie Ann M.D.   On: 08/24/2021 12:08     Medications:      amLODipine  10 mg Oral Daily   enoxaparin (LOVENOX) injection  40 mg Subcutaneous Q24H   insulin aspart  0-15 Units Subcutaneous TID WC   insulin glargine-yfgn  10 Units Subcutaneous BID   ketorolac  1 drop Both Eyes QID   lactulose  20 g Oral Q6H   levothyroxine  50 mcg Oral Q0600   lisinopril  20 mg Oral BID   pantoprazole  40 mg Oral Daily   polyethylene glycol  17 g Oral BID   prednisoLONE acetate  1 drop Both Eyes TID   sodium chloride  1 g Oral TID WC   acetaminophen **OR** acetaminophen, guaiFENesin, hydrALAZINE, ipratropium-albuterol, metoprolol tartrate, ondansetron **OR** ondansetron (ZOFRAN) IV, senna-docusate, traZODone  Assessment/ Plan:  56 y.o. male  with previous medical conditions including diabetes and  hypertension, who was admitted to [facility] on 08/22/2021 for Hyponatremia [E87.1]  Hyponatremia likely due to excessive fluid intake.  Status post low-dose tolvaptan x2. Status post CT chest with no acute findings.   Sodium 130 today.  Continue fluid restriction.  Will prescribe sodium tablets 1 g 3 times daily  Diabetes mellitus.  Last A1c 7.5. Continue current regimen including Lantus monitor blood sugar.  Glucose stable this admission  Hypertension continue home regimen with amlodipine and lisinopril.  Monitor blood pressure closely.  BP stable     LOS: 3 Latrice Storlie 2/20/20232:47 PM

## 2021-08-25 NOTE — Progress Notes (Signed)
Still having quite a bit of lower back pain; perhaps L4-L5 disc herniation is contributing to it. Will add Decadron 4mg  q8hrs, and then transition to PO daily to complete a week course.  His T4 has resulted, appears relatively normal but will try Synthroid for a few weeks and have his PCP recheck it in 4-6 weeks.   Son is at the bedside.   MD

## 2021-08-26 DIAGNOSIS — E875 Hyperkalemia: Secondary | ICD-10-CM

## 2021-08-26 LAB — CBC
HCT: 30.5 % — ABNORMAL LOW (ref 39.0–52.0)
Hemoglobin: 10.8 g/dL — ABNORMAL LOW (ref 13.0–17.0)
MCH: 30.2 pg (ref 26.0–34.0)
MCHC: 35.4 g/dL (ref 30.0–36.0)
MCV: 85.2 fL (ref 80.0–100.0)
Platelets: 347 10*3/uL (ref 150–400)
RBC: 3.58 MIL/uL — ABNORMAL LOW (ref 4.22–5.81)
RDW: 12.9 % (ref 11.5–15.5)
WBC: 4.9 10*3/uL (ref 4.0–10.5)
nRBC: 0 % (ref 0.0–0.2)

## 2021-08-26 LAB — BASIC METABOLIC PANEL
Anion gap: 4 — ABNORMAL LOW (ref 5–15)
BUN: 24 mg/dL — ABNORMAL HIGH (ref 6–20)
CO2: 26 mmol/L (ref 22–32)
Calcium: 8.6 mg/dL — ABNORMAL LOW (ref 8.9–10.3)
Chloride: 100 mmol/L (ref 98–111)
Creatinine, Ser: 0.99 mg/dL (ref 0.61–1.24)
GFR, Estimated: >60 mL/min (ref >60)
Glucose, Bld: 208 mg/dL — ABNORMAL HIGH (ref 70–99)
Potassium: 5.5 mmol/L — ABNORMAL HIGH (ref 3.5–5.1)
Sodium: 130 mmol/L — ABNORMAL LOW (ref 135–145)

## 2021-08-26 LAB — GLUCOSE, CAPILLARY: Glucose-Capillary: 231 mg/dL — ABNORMAL HIGH (ref 70–99)

## 2021-08-26 LAB — MAGNESIUM: Magnesium: 1.9 mg/dL (ref 1.7–2.4)

## 2021-08-26 MED ORDER — DEXAMETHASONE 4 MG PO TABS: 4.0000 mg | ORAL_TABLET | Freq: Two times a day (BID) | ORAL | 0 refills | Status: AC

## 2021-08-26 MED ORDER — SODIUM ZIRCONIUM CYCLOSILICATE 10 G PO PACK
10.0000 g | PACK | Freq: Once | ORAL | Status: AC
  Administered 2021-08-26: 10 g via ORAL
  Filled 2021-08-26: qty 1

## 2021-08-26 MED ORDER — LEVOTHYROXINE SODIUM 50 MCG PO TABS: 50.0000 ug | ORAL_TABLET | Freq: Every day | ORAL | 3 refills | Status: AC

## 2021-08-26 NOTE — Progress Notes (Signed)
Alexander Kerr to be D/C'd Home per MD order.  Discussed prescriptions and follow up appointments with the patient. Prescriptions given to patient, medication list explained in detail. Pt verbalized understanding. Good Rx coupons given to pt.  Allergies as of 08/26/2021   No Known Allergies      Medication List     TAKE these medications    amLODipine 10 MG tablet Commonly known as: NORVASC Take 10 mg by mouth daily.   dexamethasone 4 MG tablet Commonly known as: DECADRON Take 1 tablet (4 mg total) by mouth 2 (two) times daily for 4 days.   ketorolac 0.5 % ophthalmic solution Commonly known as: ACULAR SMARTSIG:In Eye(s)   Lantus SoloStar 100 UNIT/ML Solostar Pen Generic drug: insulin glargine Inject 10 Units into the skin 2 (two) times daily.   levothyroxine 50 MCG tablet Commonly known as: SYNTHROID Take 1 tablet (50 mcg total) by mouth daily before breakfast.   lisinopril 20 MG tablet Commonly known as: ZESTRIL Take 20 mg by mouth 2 (two) times daily.   omeprazole 20 MG capsule Commonly known as: PRILOSEC Take 20 mg by mouth 2 (two) times daily.   prednisoLONE acetate 1 % ophthalmic suspension Commonly known as: PRED FORTE 1 drop in the morning, at noon, and at bedtime.   Voltaren 1 % Gel Generic drug: diclofenac Sodium Apply 1 application topically 4 (four) times daily as needed.        Vitals:   08/26/21 0450 08/26/21 0748  BP: 131/69 (!) 144/66  Pulse: 76 79  Resp: 20   Temp: 97.8 F (36.6 C) 98.2 F (36.8 C)  SpO2: 99% 98%    Tele box removed and returned. Skin clean, dry and intact without evidence of skin break down, no evidence of skin tears noted. IV catheter discontinued intact. Site without signs and symptoms of complications. Dressing and pressure applied. Pt denies pain at this time. No complaints noted.  An After Visit Summary was printed and given to the patient. Patient escorted via [place], and D/C home via private auto.  [name]

## 2021-08-26 NOTE — Plan of Care (Signed)
°  Problem: Clinical Measurements: Goal: Ability to maintain clinical measurements within normal limits will improve Outcome: Progressing Goal: Will remain free from infection Outcome: Progressing Goal: Diagnostic test results will improve Outcome: Progressing Goal: Cardiovascular complication will be avoided Outcome: Progressing   Problem: Pain Managment: Goal: General experience of comfort will improve Outcome: Progressing   Pt is involved in and agrees with the plan of care. Interpreter used, Complained of back pain; Tylenol given. Pt had several BM's yertardy.

## 2021-08-26 NOTE — Assessment & Plan Note (Signed)
Potassium of 5.5 today, no new EKG changes noted.  We will give 1 dose of Lokelma prior to discharge.  Repeat blood work with PCP in 1 week

## 2021-08-26 NOTE — TOC Transition Note (Signed)
Transition of Care [facility]) - CM/SW Discharge Note   Patient Details  Name: Alexander Kerr MRN: [id number] Date of Birth: 13-Sep-1965  [facility] CM/SW Contact:  Candie Chroman, LCSW Phone Number: 08/26/2021, 10:53 AM   Clinical Narrative:   Patient has orders to discharge home today. Met with patient at bedside using video interpreter [place] [phone]). Patient confirmed he does not have insurance. Found GoodRx coupons for Decadron ($7.97) and Synthroid ($50.08). Patient said he could afford this. Printed and put on front of chart. RN aware. No further concerns. CSW signing off.  Final next level of care: Home/Self Care Barriers to Discharge: No Barriers Identified   Patient Goals and CMS Choice        Discharge Placement                Patient to be transferred to facility by: Family member at bedside   Patient and family notified of of transfer: 08/26/21  Discharge Plan and Services                                     Social Determinants of Health (SDOH) Interventions     Readmission Risk Interventions No flowsheet data found.

## 2021-08-26 NOTE — Progress Notes (Signed)
[facility]  ROUNDING NOTE   Subjective:   Patient seen resting quietly Continues to complain of abdominal tenderness States he had small BM this morning Tolerating meals and fluid intake   Objective:  Vital signs in last 24 hours:  Temp:  [97.8 F (36.6 C)-98.5 F (36.9 C)] 98.2 F (36.8 C) (02/21 0748) Pulse Rate:  [76-81] 79 (02/21 0748) Resp:  [18-20] 20 (02/21 0450) BP: (107-145)/(66-82) 144/66 (02/21 0748) SpO2:  [98 %-100 %] 98 % (02/21 0748)  Weight change:  Filed Weights   08/22/21 0430  Weight: 61.2 kg    Intake/Output:   Intake/Output this shift:  Total I/O In: 240 [P.O.:240] Out: -   Physical Exam: General: No acute distress  Head: Normocephalic, atraumatic. Moist oral mucosal membranes  Eyes: Anicteric  Lungs:  Clear to auscultation, normal effort  Heart: S1S2 no rubs  Abdomen:  Soft, nontender, bowel sounds present  Extremities: Trace peripheral edema  Neurologic: Awake, alert, following commands  Skin: No acute rash  Access: No access    Basic Metabolic Panel: Recent Labs  Lab 08/22/21 0600 08/22/21 2127 08/23/21 0534 08/23/21 1343 08/24/21 0504 08/24/21 1304 08/24/21 2029 08/25/21 0428 08/26/21 0418  NA 123*   < > 136   < > 128* 129* 130* 130* 130*  K 4.9  --  4.4  --  4.7  --   --  4.5 5.5*  CL 93*  --  107  --  99  --   --  102 100  CO2 24  --  24  --  23  --   --  24 26  GLUCOSE 101*  --  108*  --  152*  --   --  131* 208*  BUN 21*  --  20  --  23*  --   --  22* 24*  CREATININE 0.91  --  0.79  --  0.78  --   --  0.94 0.99  CALCIUM 8.8*  --  8.7*  --  8.4*  --   --  8.5* 8.6*  MG  --   --   --   --  1.9  --   --  1.8 1.9   < > = values in this interval not displayed.     Liver Function Tests: Recent Labs  Lab 08/22/21 0441 08/22/21 1504  AST 18 18  ALT 19 21  ALKPHOS 65 71  BILITOT 0.7 0.6  PROT 5.9* 5.5*  ALBUMIN 2.8* 2.6*    Recent Labs  Lab 08/22/21 0441  LIPASE 33    No results for  input(s): AMMONIA in the last 168 hours.  CBC: Recent Labs  Lab 08/22/21 0441 08/23/21 0534 08/24/21 0504 08/25/21 0428 08/26/21 0418  WBC 5.8 5.1 5.5 5.7 4.9  NEUTROABS 2.6  --   --   --   --   HGB 13.0 12.1* 11.3* 10.7* 10.8*  HCT 35.2* 34.1* 32.1* 30.1* 30.5*  MCV 83.4 84.6 84.7 84.8 85.2  PLT 368 368 363 317 347     Cardiac Enzymes: No results for input(s): CKTOTAL, CKMB, CKMBINDEX, TROPONINI in the last 168 hours.  BNP: Invalid input(s): POCBNP  CBG: Recent Labs  Lab 08/25/21 0803 08/25/21 1222 08/25/21 1632 08/25/21 2110 08/26/21 0746  GLUCAP 138* 209* 117* 304* 231*     Microbiology: Results for orders placed or performed during the hospital encounter of 08/22/21  Resp Panel by RT-PCR (Flu A&B, Covid) Nasopharyngeal Swab     Status: None  Collection Time: 08/22/21  6:00 AM   Specimen: Nasopharyngeal Swab; Nasopharyngeal(NP) swabs in vial transport medium  Result Value Ref Range Status   SARS Coronavirus 2 by RT PCR NEGATIVE NEGATIVE Final    Comment: (NOTE) SARS-CoV-2 target nucleic acids are NOT DETECTED.  The SARS-CoV-2 RNA is generally detectable in upper respiratory specimens during the acute phase of infection. The lowest concentration of SARS-CoV-2 viral copies this assay can detect is 138 copies/mL. A negative result does not preclude SARS-Cov-2 infection and should not be used as the sole basis for treatment or other patient management decisions. A negative result may occur with  improper specimen collection/handling, submission of specimen other than nasopharyngeal swab, presence of viral mutation(s) within the areas targeted by this assay, and inadequate number of viral copies(<138 copies/mL). A negative result must be combined with clinical observations, patient history, and epidemiological information. The expected result is Negative.  Fact Sheet for Patients:  BloggerCourse.comhttps://www.fda.gov/media/152166/download  Fact Sheet for Healthcare  Providers:  SeriousBroker.ithttps://www.fda.gov/media/152162/download  This test is no t yet approved or cleared by the [place] FDA and  has been authorized for detection and/or diagnosis of SARS-CoV-2 by FDA under an Emergency Use Authorization (EUA). This EUA will remain  in effect (meaning this test can be used) for the duration of the COVID-19 declaration under Section 564(b)(1) of the Act, 21 U.S.C.section 360bbb-3(b)(1), unless the authorization is terminated  or revoked sooner.       Influenza A by PCR NEGATIVE NEGATIVE Final   Influenza B by PCR NEGATIVE NEGATIVE Final    Comment: (NOTE) The Xpert Xpress SARS-CoV-2/FLU/RSV plus assay is intended as an aid in the diagnosis of influenza from Nasopharyngeal swab specimens and should not be used as a sole basis for treatment. Nasal washings and aspirates are unacceptable for Xpert Xpress SARS-CoV-2/FLU/RSV testing.  Fact Sheet for Patients: BloggerCourse.comhttps://www.fda.gov/media/152166/download  Fact Sheet for Healthcare Providers: SeriousBroker.ithttps://www.fda.gov/media/152162/download  This test is not yet approved or cleared by the [place] FDA and has been authorized for detection and/or diagnosis of SARS-CoV-2 by FDA under an Emergency Use Authorization (EUA). This EUA will remain in effect (meaning this test can be used) for the duration of the COVID-19 declaration under Section 564(b)(1) of the Act, 21 U.S.C. section 360bbb-3(b)(1), unless the authorization is terminated or revoked.  Performed at [facility], [address] [place], [place] [id number]     Coagulation Studies: No results for input(s): LABPROT, INR in the last 72 hours.  Urinalysis: No results for input(s): COLORURINE, LABSPEC, PHURINE, GLUCOSEU, HGBUR, BILIRUBINUR, KETONESUR, PROTEINUR, UROBILINOGEN, NITRITE, LEUKOCYTESUR in the last 72 hours.  Invalid input(s): APPERANCEUR     Imaging: DG Abd 1 View  Result Date: 08/25/2021 CLINICAL DATA:  Abdominal  distension reported on previous imaging. EXAM: ABDOMEN - 1 VIEW COMPARISON:  August 24, 2021 FINDINGS: EKG leads project over the lower chest and abdomen. No support tubes are demonstrated. Mild gaseous distension of the stomach. Stool and gas throughout the colon to the level of the rectum. Mild colonic distension without change gaseous distension throughout with scattered areas of stool. No small bowel dilation. No abnormal calcifications. On limited assessment there is no acute skeletal finding. Soft tissues are grossly unremarkable. IMPRESSION: 1. Mild gaseous distension of the colon persists with gas to the level of the rectum. Findings could reflect a component of mild ileus. Overall findings are not substantially changed compared to previous imaging. 2. No support tubes visualized. Electronically Signed   By: Jason FilaGeoffrey  Wile M.D.  On: 08/25/2021 09:50     Medications:      amLODipine  10 mg Oral Daily   enoxaparin (LOVENOX) injection  40 mg Subcutaneous Q24H   insulin aspart  0-15 Units Subcutaneous TID WC   insulin glargine-yfgn  10 Units Subcutaneous BID   ketorolac  1 drop Both Eyes QID   levothyroxine  50 mcg Oral Q0600   lisinopril  20 mg Oral BID   pantoprazole  40 mg Oral Daily   polyethylene glycol  17 g Oral BID   prednisoLONE acetate  1 drop Both Eyes TID   sodium chloride  1 g Oral TID WC   acetaminophen **OR** acetaminophen, guaiFENesin, hydrALAZINE, ipratropium-albuterol, metoprolol tartrate, ondansetron **OR** ondansetron (ZOFRAN) IV, senna-docusate, traZODone  Assessment/ Plan:  56 y.o. male  with previous medical conditions including diabetes and hypertension, who was admitted to [facility] on 08/22/2021 for Hyponatremia [E87.1]  Hyponatremia likely due to excessive fluid intake.  Status post low-dose tolvaptan x2. Status post CT chest with no acute findings.   Sodium remains 130.  Continue fluid restriction.  Continue sodium tablets 1 g 3 times daily.   Diabetes mellitus  type II without complication.  Last A1c 7.5. Continue current regimen including Lantus monitor blood sugar.  Glucose stable   Hypertension continue home regimen with amlodipine and lisinopril.  Monitor blood pressure closely.       LOS: Brock Hall 2/21/202312:58 PM

## 2021-08-26 NOTE — Discharge Summary (Addendum)
Physician Discharge Summary  Alexander Kerr [id number] DOB: 1966-02-22 DOA: 08/22/2021  PCP[facility] date: 08/22/2021 Discharge date: 08/26/2021  Admitted From: Home Disposition: Home  Recommendations for Outpatient Follow-up:  Follow up with PCP in 1-2 weeks Please obtain BMP/CBC in one week your next doctors visit.  Decadron 4 mg twice daily for 4 days Synthroid 50 mcg daily started.  Advised to repeat blood work with PCP in 1 month Recommend outpatient follow-up with neurology especially if his lower back pain continues to worsen.  Discharge Condition: Stable CODE STATUS: Full code Diet recommendation: Regular diet  Brief/Interim Summary:   56 year old with history of DM2, HTN admitted to the hospital for ongoing abdominal pain for 2 weeks.  Upon admission he was noted to be hyponatremic, had severe constipation.  He also has significant amount of atherosclerotic disease seen in multiple blood vessels on the CT scan.  Initially for hyponatremia he was given tolvaptan which rapidly corrected sodium.  Nephrology started patient on D5 water and eventually sodium level stabilized.  Due to persistent lower back pain and abdominal pain with constipation he was given aggressive bowel regimen.  MRI showed L4-L5 disc herniation without compressive symptoms.  He was started on Decadron.  Advised stretching exercises/PT/OT-mobility.  Eventually if his pain continues he would benefit from follow-up with neurosurgery. His TSH was elevated, T4 high normal therefore started on Synthroid 50 mcg daily.  Will need repeat blood work as mentioned above.  During my interaction today, translator line used.  Interpreter ID [id number].  Answered all the questions upon discharge.   Assessment and Plan: * Hyponatremia- (present on admission) Initially patient was given tolvaptan which rapidly improved sodium thereafter was placed on D5 water.  Also appears to have stabilized  at 130.  CT of the chest did not reveal any lung mass.  Low back pain MRI shows lumbar disc herniation without any compressive symptoms.  He has been started on Decadron for 4 days.  Advised to follow-up outpatient with neurosurgery especially if his pain continues.  Constipation This appears to have improved.  Advised to continue ambulation and use stool softeners as necessary to have daily bowel movements.   Hyperkalemia Potassium of 5.5 today, no new EKG changes noted.  We will give 1 dose of Lokelma prior to discharge.  Repeat blood work with PCP in 1 week  Elevated TSH T4 minimally elevated.  We will start daily Synthroid.  Repeat blood work with PCP in about 4 to 6 weeks  Hypertension- (present on admission) Continue home Norvasc, lisinopril.  Diabetes mellitus without complication (Alexander Kerr) Continue patient's Lantus, sliding scale and Accu-Chek.  Plan to resume his home regimen upon discharge       Body mass index is 24.69 kg/m.         Discharge Diagnoses:  Principal Problem:   Hyponatremia Active Problems:   Constipation   Low back pain   Diabetes mellitus without complication (HCC)   Hypertension   Elevated TSH   Hyperkalemia     Subjective: Interpreter used-ID [id number] Patient feels better but does still have lower back pain.  Discharge Exam: Vitals:   08/26/21 0450 08/26/21 0748  BP: 131/69 (!) 144/66  Pulse: 76 79  Resp: 20   Temp: 97.8 F (36.6 C) 98.2 F (36.8 C)  SpO2: 99% 98%   Vitals:   08/25/21 1635 08/25/21 1916 08/26/21 0450 08/26/21 0748  BP: (!) 145/69 107/82 131/69 (!) 144/66  Pulse: 80 81 76 79  Resp: 18 20 20    Temp: 98.5 F (36.9 C) 98.2 F (36.8 C) 97.8 F (36.6 C) 98.2 F (36.8 C)  TempSrc: Oral Oral Oral   SpO2: 100% 100% 99% 98%  Weight:      Height:        General: Pt is alert, awake, not in acute distress Cardiovascular: RRR, S1/S2 +, no rubs, no gallops Respiratory: CTA bilaterally, no wheezing, no  rhonchi Abdominal: Soft, NT, ND, bowel sounds + Extremities: no edema, no cyanosis  Discharge Instructions   Allergies as of 08/26/2021   No Known Allergies      Medication List     TAKE these medications    amLODipine 10 MG tablet Commonly known as: NORVASC Take 10 mg by mouth daily.   dexamethasone 4 MG tablet Commonly known as: DECADRON Take 1 tablet (4 mg total) by mouth 2 (two) times daily for 4 days.   ketorolac 0.5 % ophthalmic solution Commonly known as: ACULAR SMARTSIG:In Eye(s)   Lantus SoloStar 100 UNIT/ML Solostar Pen Generic drug: insulin glargine Inject 10 Units into the skin 2 (two) times daily.   levothyroxine 50 MCG tablet Commonly known as: SYNTHROID Take 1 tablet (50 mcg total) by mouth daily before breakfast.   lisinopril 20 MG tablet Commonly known as: ZESTRIL Take 20 mg by mouth 2 (two) times daily.   omeprazole 20 MG capsule Commonly known as: PRILOSEC Take 20 mg by mouth 2 (two) times daily.   prednisoLONE acetate 1 % ophthalmic suspension Commonly known as: PRED FORTE 1 drop in the morning, at noon, and at bedtime.   Voltaren 1 % Gel Generic drug: diclofenac Sodium Apply 1 application topically 4 (four) times daily as needed.        Alexander Kerr, Alexander Kerr. Go on 08/29/2021.   Why: 2:40pm appointment Contact information: [name] [address] [phone]                No Known Allergies  You were cared for by a hospitalist during your hospital stay. If you have any questions about your discharge medications or the care you received while you were in the hospital after you are discharged, you can call the unit and asked to speak with the hospitalist on call if the hospitalist that took care of you is not available. Once you are discharged, your primary care physician will handle any further medical issues. Please note that no refills for any discharge medications will be  authorized once you are discharged, as it is imperative that you return to your primary care physician (or establish a relationship with a primary care physician if you do not have one) for your aftercare needs so that they can reassess your need for medications and monitor your lab values.   Procedures/Studies: DG Chest 2 View  Result Date: 08/22/2021 CLINICAL DATA:  Upper abdominal pain. EXAM: CHEST - 2 VIEW COMPARISON:  None. FINDINGS: The heart size and mediastinal contours are within normal limits. Both lungs are clear. The visualized skeletal structures are unremarkable. IMPRESSION: No active cardiopulmonary disease. Electronically Signed   By: Marijo Conception M.D.   On: 08/22/2021 06:44   DG Abd 1 View  Result Date: 08/25/2021 CLINICAL DATA:  Abdominal distension reported on previous imaging. EXAM: ABDOMEN - 1 VIEW COMPARISON:  August 24, 2021 FINDINGS: EKG leads project over the lower chest and abdomen. No support tubes are demonstrated. Mild gaseous distension of the stomach.  Stool and gas throughout the colon to the level of the rectum. Mild colonic distension without change gaseous distension throughout with scattered areas of stool. No small bowel dilation. No abnormal calcifications. On limited assessment there is no acute skeletal finding. Soft tissues are grossly unremarkable. IMPRESSION: 1. Mild gaseous distension of the colon persists with gas to the level of the rectum. Findings could reflect a component of mild ileus. Overall findings are not substantially changed compared to previous imaging. 2. No support tubes visualized. Electronically Signed   By: Zetta Bills M.D.   On: 08/25/2021 09:50   DG Abd 1 View  Result Date: 08/24/2021 CLINICAL DATA:  Abdominal distention. EXAM: ABDOMEN - 1 VIEW COMPARISON:  CT scan 08/22/2021 FINDINGS: Diffuse gaseous distention of the colon evident. No substantial small bowel gaseous dilatation. Visualized bony anatomy unremarkable. IMPRESSION:  Diffuse gaseous distention of the colon. Component of ileus not excluded. Electronically Signed   By: Misty Stanley M.D.   On: 08/24/2021 12:10   CT CHEST W CONTRAST  Result Date: 08/24/2021 CLINICAL DATA:  Hyponatremia, evaluate for underlying lung mass EXAM: CT CHEST WITH CONTRAST TECHNIQUE: Multidetector CT imaging of the chest was performed during intravenous contrast administration. RADIATION DOSE REDUCTION: This exam was performed according to the departmental dose-optimization program which includes automated exposure control, adjustment of the mA and/or kV according to patient size and/or use of iterative reconstruction technique. CONTRAST:  36mL OMNIPAQUE IOHEXOL 300 MG/ML  SOLN COMPARISON:  None. FINDINGS: Cardiovascular: Aortic atherosclerosis. Normal heart size. Three-vessel coronary artery calcifications. No pericardial effusion. Mediastinum/Nodes: No enlarged mediastinal, hilar, or axillary lymph nodes. Thyroid gland, trachea, and esophagus demonstrate no significant findings. Lungs/Pleura: Minimal dependent bibasilar partial atelectasis or scarring. Occasional small pulmonary nodules for example in the dependent left lung base measuring 0.4 cm (series 3, image 94) no pleural effusion or pneumothorax. Upper Abdomen: No acute abnormality. Musculoskeletal: No chest wall abnormality. No suspicious osseous lesions identified. IMPRESSION: 1. No evidence of mass or lymphadenopathy in the chest. 2. Occasional small pulmonary nodules, measuring no greater than 0.4 cm. No follow-up needed if patient is low-risk (and has no known or suspected primary neoplasm). Non-contrast chest CT can be considered in 12 months if patient is high-risk. This recommendation follows the consensus statement: Guidelines for Management of Incidental Pulmonary Nodules Detected on CT Images: From the Fleischner Society 2017; Radiology 2017; 284:228-243. 3. Coronary artery disease. Aortic Atherosclerosis (ICD10-I70.0).  Electronically Signed   By: Delanna Ahmadi M.D.   On: 08/24/2021 12:40   MR Lumbar Spine W Wo Contrast  Result Date: 08/24/2021 CLINICAL DATA:  56 year old male with low back pain, possible infection. EXAM: MRI LUMBAR SPINE WITHOUT AND WITH CONTRAST TECHNIQUE: Multiplanar and multiecho pulse sequences of the lumbar spine were obtained without and with intravenous contrast. CONTRAST:  35mL GADAVIST GADOBUTROL 1 MMOL/ML IV SOLN COMPARISON:  CT Abdomen and Pelvis 08/22/2021. FINDINGS: Segmentation:  Normal on the comparison. Alignment:  Straightening of lumbar lordosis, otherwise normal. Vertebrae: No marrow edema or evidence of acute osseous abnormality. Visualized bone marrow signal is within normal limits. Intact visible sacrum. Conus medullaris and cauda equina: Conus extends to the T12-L1 level. No lower spinal cord or conus signal abnormality. No abnormal intradural enhancement or dural thickening. Normal cauda equina nerve roots. Paraspinal and other soft tissues: Negative. Disc levels: T11-T12: Negative. T12-L1:  Negative. L1-L2:  Negative. L2-L3:  Negative. L3-L4: Mild disc desiccation and circumferential disc bulge. No spinal or lateral recess stenosis. Borderline to mild bilateral L3 foraminal stenosis.  L4-L5: Subtle left far lateral disc protrusion with annular fissure and enhancement (series 8, image 28). But otherwise negative. No spinal stenosis or foraminal involvement. L5-S1:  Subtle circumferential disc bulging. Otherwise negative. IMPRESSION: 1. No evidence of infection and largely normal for age MRI appearance of the lumbar spine. 2. 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 3. Minimal lumbar spine degeneration otherwise. Borderline to mild bilateral L3 neural foraminal stenosis. Electronically Signed   By: Genevie Ann M.D.   On: 08/24/2021 12:08   CT ABDOMEN PELVIS W CONTRAST  Result Date: 08/22/2021 CLINICAL DATA:   56 year old male with abdominal distension. Constipation. Abdominal pain for 2 weeks. EXAM: CT ABDOMEN AND PELVIS WITH CONTRAST TECHNIQUE: Multidetector CT imaging of the abdomen and pelvis was performed using the standard protocol following bolus administration of intravenous contrast. RADIATION DOSE REDUCTION: This exam was performed according to the departmental dose-optimization program which includes automated exposure control, adjustment of the mA and/or kV according to patient size and/or use of iterative reconstruction technique. CONTRAST:  141mL OMNIPAQUE IOHEXOL 300 MG/ML  SOLN COMPARISON:  Chest radiographs 0629 hours today. FINDINGS: Lower chest: Negative.  No pericardial or pleural effusion. Hepatobiliary: Negative liver and gallbladder. Pancreas: Negative. Spleen: Negative. Adrenals/Urinary Tract: Normal adrenal glands. Kidneys appear symmetric and normal. Normal renal enhancement and contrast excretion. No nephrolithiasis or pararenal inflammation. Distended but otherwise unremarkable bladder (sagittal image 52). Stomach/Bowel: Negative rectum but retained stool throughout the upstream large bowel which is mildly redundant. No large bowel wall thickening or inflammation. Right lower quadrant appendix appears to contain oral contrast and otherwise appears normal (coronal image 42). Negative terminal ileum. No dilated small bowel. Unremarkable stomach and duodenum. No free air, free fluid, mesenteric inflammation. Vascular/Lymphatic: No lymphadenopathy identified. Aortoiliac calcified atherosclerosis. And extensive visible femoral artery calcified atherosclerosis. Major arterial structures remain patent. Portal venous system is patent. Reproductive: Negative. Other: No pelvic free fluid. There are relatively large left pelvic floor phleboliths. Musculoskeletal: Negative. Small left femoral head benign bone island. IMPRESSION: 1. No acute or inflammatory process identified in the abdomen or pelvis.  Retained stool throughout the large bowel (sparing the rectum) may reflect constipation. Normal appendix. Distended but otherwise unremarkable bladder, query urinary retention. 2. Aortic Atherosclerosis (ICD10-I70.0). Electronically Signed   By: Genevie Ann M.D.   On: 08/22/2021 07:11     The results of significant diagnostics from this hospitalization (including imaging, microbiology, ancillary and laboratory) are listed below for reference.     Microbiology: Recent Results (from the past 240 hour(s))  Resp Panel by RT-PCR (Flu A&B, Covid) Nasopharyngeal Swab     Status: None   Collection Time: 08/22/21  6:00 AM   Specimen: Nasopharyngeal Swab; Nasopharyngeal(NP) swabs in vial transport medium  Result Value Ref Range Status   SARS Coronavirus 2 by RT PCR NEGATIVE NEGATIVE Final    Comment: (NOTE) SARS-CoV-2 target nucleic acids are NOT DETECTED.  The SARS-CoV-2 RNA is generally detectable in upper respiratory specimens during the acute phase of infection. The lowest concentration of SARS-CoV-2 viral copies this assay can detect is 138 copies/mL. A negative result does not preclude SARS-Cov-2 infection and should not be used as the sole basis for treatment or other patient management decisions. A negative result may occur with  improper specimen collection/handling, submission of specimen other than nasopharyngeal swab, presence of viral mutation(s) within the areas targeted by this assay, and inadequate number of viral copies(<138 copies/mL). A negative result must be  combined with clinical observations, patient history, and epidemiological information. The expected result is Negative.  Fact Sheet for Patients:  EntrepreneurPulse.com.au  Fact Sheet for Healthcare Providers:  IncredibleEmployment.be  This test is no t yet approved or cleared by the [place] FDA and  has been authorized for detection and/or diagnosis of SARS-CoV-2 by FDA under  an Emergency Use Authorization (EUA). This EUA will remain  in effect (meaning this test can be used) for the duration of the COVID-19 declaration under Section 564(b)(1) of the Act, 21 U.S.C.section 360bbb-3(b)(1), unless the authorization is terminated  or revoked sooner.       Influenza A by PCR NEGATIVE NEGATIVE Final   Influenza B by PCR NEGATIVE NEGATIVE Final    Comment: (NOTE) The Xpert Xpress SARS-CoV-2/FLU/RSV plus assay is intended as an aid in the diagnosis of influenza from Nasopharyngeal swab specimens and should not be used as a sole basis for treatment. Nasal washings and aspirates are unacceptable for Xpert Xpress SARS-CoV-2/FLU/RSV testing.  Fact Sheet for Patients: EntrepreneurPulse.com.au  Fact Sheet for Healthcare Providers: IncredibleEmployment.be  This test is not yet approved or cleared by the [place] FDA and has been authorized for detection and/or diagnosis of SARS-CoV-2 by FDA under an Emergency Use Authorization (EUA). This EUA will remain in effect (meaning this test can be used) for the duration of the COVID-19 declaration under Section 564(b)(1) of the Act, 21 U.S.C. section 360bbb-3(b)(1), unless the authorization is terminated or revoked.  Performed at [facility], [address] [place], [place] [address]      Labs: BNP (last 3 results) No results for input(s): BNP in the last 8760 hours. Basic Metabolic Panel: Recent Labs  Lab 08/22/21 0600 08/22/21 2127 08/23/21 0534 08/23/21 1343 08/24/21 0504 08/24/21 1304 08/24/21 2029 08/25/21 0428 08/26/21 0418  NA 123*   < > 136   < > 128* 129* 130* 130* 130*  K 4.9  --  4.4  --  4.7  --   --  4.5 5.5*  CL 93*  --  107  --  99  --   --  102 100  CO2 24  --  24  --  23  --   --  24 26  GLUCOSE 101*  --  108*  --  152*  --   --  131* 208*  BUN 21*  --  20  --  23*  --   --  22* 24*  CREATININE 0.91  --  0.79  --  0.78  --   --  0.94  0.99  CALCIUM 8.8*  --  8.7*  --  8.4*  --   --  8.5* 8.6*  MG  --   --   --   --  1.9  --   --  1.8 1.9   < > = values in this interval not displayed.   Liver Function Tests: Recent Labs  Lab 08/22/21 0441 08/22/21 1504  AST 18 18  ALT 19 21  ALKPHOS 65 71  BILITOT 0.7 0.6  PROT 5.9* 5.5*  ALBUMIN 2.8* 2.6*   Recent Labs  Lab 08/22/21 0441  LIPASE 33   No results for input(s): AMMONIA in the last 168 hours. CBC: Recent Labs  Lab 08/22/21 0441 08/23/21 0534 08/24/21 0504 08/25/21 0428 08/26/21 0418  WBC 5.8 5.1 5.5 5.7 4.9  NEUTROABS 2.6  --   --   --   --   HGB 13.0 12.1* 11.3* 10.7* 10.8*  HCT 35.2* 34.1* 32.1* 30.1* 30.5*  MCV 83.4 84.6 84.7 84.8 85.2  PLT 368 368 363 317 347   Cardiac Enzymes: No results for input(s): CKTOTAL, CKMB, CKMBINDEX, TROPONINI in the last 168 hours. BNP: Invalid input(s): POCBNP CBG: Recent Labs  Lab 08/25/21 0803 08/25/21 1222 08/25/21 1632 08/25/21 2110 08/26/21 0746  GLUCAP 138* 209* 117* 304* 231*   D-Dimer No results for input(s): DDIMER in the last 72 hours. Hgb A1c No results for input(s): HGBA1C in the last 72 hours. Lipid Profile No results for input(s): CHOL, HDL, LDLCALC, TRIG, CHOLHDL, LDLDIRECT in the last 72 hours. Thyroid function studies No results for input(s): TSH, T4TOTAL, T3FREE, THYROIDAB in the last 72 hours.  Invalid input(s): FREET3 Anemia work up No results for input(s): VITAMINB12, FOLATE, FERRITIN, TIBC, IRON, RETICCTPCT in the last 72 hours. Urinalysis    Component Value Date/Time   COLORURINE COLORLESS (A) 08/22/2021 0741   APPEARANCEUR CLEAR (A) 08/22/2021 0741   LABSPEC 1.011 08/22/2021 0741   PHURINE 7.0 08/22/2021 0741   GLUCOSEU NEGATIVE 08/22/2021 0741   HGBUR NEGATIVE 08/22/2021 0741   BILIRUBINUR NEGATIVE 08/22/2021 0741   KETONESUR NEGATIVE 08/22/2021 0741   PROTEINUR 30 (A) 08/22/2021 0741   NITRITE NEGATIVE 08/22/2021 0741   LEUKOCYTESUR NEGATIVE 08/22/2021 0741    Sepsis Labs Invalid input(s): PROCALCITONIN,  WBC,  LACTICIDVEN Microbiology Recent Results (from the past 240 hour(s))  Resp Panel by RT-PCR (Flu A&B, Covid) Nasopharyngeal Swab     Status: None   Collection Time: 08/22/21  6:00 AM   Specimen: Nasopharyngeal Swab; Nasopharyngeal(NP) swabs in vial transport medium  Result Value Ref Range Status   SARS Coronavirus 2 by RT PCR NEGATIVE NEGATIVE Final    Comment: (NOTE) SARS-CoV-2 target nucleic acids are NOT DETECTED.  The SARS-CoV-2 RNA is generally detectable in upper respiratory specimens during the acute phase of infection. The lowest concentration of SARS-CoV-2 viral copies this assay can detect is 138 copies/mL. A negative result does not preclude SARS-Cov-2 infection and should not be used as the sole basis for treatment or other patient management decisions. A negative result may occur with  improper specimen collection/handling, submission of specimen other than nasopharyngeal swab, presence of viral mutation(s) within the areas targeted by this assay, and inadequate number of viral copies(<138 copies/mL). A negative result must be combined with clinical observations, patient history, and epidemiological information. The expected result is Negative.  Fact Sheet for Patients:  EntrepreneurPulse.com.au  Fact Sheet for Healthcare Providers:  IncredibleEmployment.be  This test is no t yet approved or cleared by the [place] FDA and  has been authorized for detection and/or diagnosis of SARS-CoV-2 by FDA under an Emergency Use Authorization (EUA). This EUA will remain  in effect (meaning this test can be used) for the duration of the COVID-19 declaration under Section 564(b)(1) of the Act, 21 U.S.C.section 360bbb-3(b)(1), unless the authorization is terminated  or revoked sooner.       Influenza A by PCR NEGATIVE NEGATIVE Final   Influenza B by PCR NEGATIVE NEGATIVE Final     Comment: (NOTE) The Xpert Xpress SARS-CoV-2/FLU/RSV plus assay is intended as an aid in the diagnosis of influenza from Nasopharyngeal swab specimens and should not be used as a sole basis for treatment. Nasal washings and aspirates are unacceptable for Xpert Xpress SARS-CoV-2/FLU/RSV testing.  Fact Sheet for Patients: EntrepreneurPulse.com.au  Fact Sheet for Healthcare Providers: IncredibleEmployment.be  This test is not yet approved or cleared by the [place] and has been authorized  for detection and/or diagnosis of SARS-CoV-2 by FDA under an Emergency Use Authorization (EUA). This EUA will remain in effect (meaning this test can be used) for the duration of the COVID-19 declaration under Section 564(b)(1) of the Act, 21 U.S.C. section 360bbb-3(b)(1), unless the authorization is terminated or revoked.  Performed at [facility], [address] [place], [place] [address]      Time coordinating discharge:  I have spent 35 minutes face to face with the patient and on the ward discussing the patients care, assessment, plan and disposition with other care givers. >50% of the time was devoted counseling the patient about the risks and benefits of treatment/Discharge disposition and coordinating care.   SIGNED:   Damita Lack, MD  [facility] 08/26/2021, 12:38 PM   If 7PM-7AM, please contact night-coverage

## 2021-09-03 ENCOUNTER — Other Ambulatory Visit: Payer: Self-pay

## 2021-09-03 ENCOUNTER — Inpatient Hospital Stay
Admission: EM | Admit: 2021-09-03 | Discharge: 2021-09-05 | DRG: 641 | Disposition: A | Discharge status: Home / Self Care | Payer: Self-pay | Attending: Internal Medicine | Admitting: Internal Medicine

## 2021-09-03 ENCOUNTER — Encounter: Payer: Self-pay | Admitting: *Deleted

## 2021-09-03 ENCOUNTER — Observation Stay: Payer: Self-pay

## 2021-09-03 DIAGNOSIS — Z79899 Other long term (current) drug therapy: Secondary | ICD-10-CM

## 2021-09-03 DIAGNOSIS — E119 Type 2 diabetes mellitus without complications: Secondary | ICD-10-CM

## 2021-09-03 DIAGNOSIS — I10 Essential (primary) hypertension: Secondary | ICD-10-CM | POA: Diagnosis present

## 2021-09-03 DIAGNOSIS — E871 Hypo-osmolality and hyponatremia: Principal | ICD-10-CM | POA: Diagnosis present

## 2021-09-03 DIAGNOSIS — Z20822 Contact with and (suspected) exposure to covid-19: Secondary | ICD-10-CM | POA: Diagnosis present

## 2021-09-03 DIAGNOSIS — R400 Somnolence: Secondary | ICD-10-CM | POA: Diagnosis present

## 2021-09-03 DIAGNOSIS — Z794 Long term (current) use of insulin: Secondary | ICD-10-CM

## 2021-09-03 DIAGNOSIS — E039 Hypothyroidism, unspecified: Secondary | ICD-10-CM | POA: Diagnosis present

## 2021-09-03 DIAGNOSIS — E875 Hyperkalemia: Secondary | ICD-10-CM | POA: Diagnosis present

## 2021-09-03 DIAGNOSIS — D649 Anemia, unspecified: Secondary | ICD-10-CM | POA: Diagnosis present

## 2021-09-03 DIAGNOSIS — Z87891 Personal history of nicotine dependence: Secondary | ICD-10-CM

## 2021-09-03 DIAGNOSIS — D638 Anemia in other chronic diseases classified elsewhere: Secondary | ICD-10-CM | POA: Diagnosis present

## 2021-09-03 DIAGNOSIS — E11319 Type 2 diabetes mellitus with unspecified diabetic retinopathy without macular edema: Secondary | ICD-10-CM | POA: Diagnosis present

## 2021-09-03 DIAGNOSIS — R7989 Other specified abnormal findings of blood chemistry: Secondary | ICD-10-CM | POA: Diagnosis present

## 2021-09-03 DIAGNOSIS — K5900 Constipation, unspecified: Secondary | ICD-10-CM | POA: Diagnosis present

## 2021-09-03 DIAGNOSIS — E1165 Type 2 diabetes mellitus with hyperglycemia: Secondary | ICD-10-CM | POA: Diagnosis not present

## 2021-09-03 DIAGNOSIS — Z79890 Hormone replacement therapy (postmenopausal): Secondary | ICD-10-CM

## 2021-09-03 LAB — COMPREHENSIVE METABOLIC PANEL
ALT: 27 U/L (ref 0–44)
AST: 17 U/L (ref 15–41)
Albumin: 2.7 g/dL — ABNORMAL LOW (ref 3.5–5.0)
Alkaline Phosphatase: 68 U/L (ref 38–126)
Anion gap: 8 (ref 5–15)
BUN: 12 mg/dL (ref 6–20)
CO2: 24 mmol/L (ref 22–32)
Calcium: 8.4 mg/dL — ABNORMAL LOW (ref 8.9–10.3)
Chloride: 84 mmol/L — ABNORMAL LOW (ref 98–111)
Creatinine, Ser: 0.61 mg/dL (ref 0.61–1.24)
GFR, Estimated: >60 mL/min (ref >60)
Glucose, Bld: 122 mg/dL — ABNORMAL HIGH (ref 70–99)
Potassium: 5.4 mmol/L — ABNORMAL HIGH (ref 3.5–5.1)
Sodium: 116 mmol/L — CL (ref 135–145)
Total Bilirubin: 0.4 mg/dL (ref 0.3–1.2)
Total Protein: 5.3 g/dL — ABNORMAL LOW (ref 6.5–8.1)

## 2021-09-03 LAB — RESP PANEL BY RT-PCR (FLU A&B, COVID) ARPGX2
Influenza A by PCR: NEGATIVE
Influenza B by PCR: NEGATIVE
SARS Coronavirus 2 by RT PCR: NEGATIVE

## 2021-09-03 LAB — CBC
HCT: 32 % — ABNORMAL LOW (ref 39.0–52.0)
Hemoglobin: 11.6 g/dL — ABNORMAL LOW (ref 13.0–17.0)
MCH: 30.1 pg (ref 26.0–34.0)
MCHC: 36.3 g/dL — ABNORMAL HIGH (ref 30.0–36.0)
MCV: 82.9 fL (ref 80.0–100.0)
Platelets: 342 10*3/uL (ref 150–400)
RBC: 3.86 MIL/uL — ABNORMAL LOW (ref 4.22–5.81)
RDW: 12.5 % (ref 11.5–15.5)
WBC: 7.5 10*3/uL (ref 4.0–10.5)
nRBC: 0 % (ref 0.0–0.2)

## 2021-09-03 LAB — URINALYSIS, ROUTINE W REFLEX MICROSCOPIC
Bacteria, UA: NONE SEEN
Bilirubin Urine: NEGATIVE
Glucose, UA: 50 mg/dL — AB
Ketones, ur: NEGATIVE mg/dL
Leukocytes,Ua: NEGATIVE
Nitrite: NEGATIVE
Protein, ur: 100 mg/dL — AB
Specific Gravity, Urine: 1.006 (ref 1.005–1.030)
Squamous Epithelial / HPF: NONE SEEN (ref 0–5)
pH: 5 (ref 5.0–8.0)

## 2021-09-03 LAB — LIPASE, BLOOD: Lipase: 45 U/L (ref 11–51)

## 2021-09-03 MED ORDER — ONDANSETRON HCL 4 MG/2ML IJ SOLN
4.0000 mg | Freq: Four times a day (QID) | INTRAMUSCULAR | Status: DC | PRN
  Administered 2021-09-04: 4 mg via INTRAVENOUS
  Filled 2021-09-03: qty 2

## 2021-09-03 MED ORDER — INSULIN ASPART 100 UNIT/ML IJ SOLN: 0.0000 [IU] | Freq: Every day | INTRAMUSCULAR | Status: DC

## 2021-09-03 MED ORDER — INSULIN ASPART 100 UNIT/ML IJ SOLN
0.0000 [IU] | Freq: Three times a day (TID) | INTRAMUSCULAR | Status: DC
  Administered 2021-09-05: 1 [IU] via SUBCUTANEOUS
  Filled 2021-09-03: qty 1

## 2021-09-03 MED ORDER — HEPARIN SODIUM (PORCINE) 5000 UNIT/ML IJ SOLN: 5000.0000 [IU] | Freq: Three times a day (TID) | INTRAMUSCULAR | Status: DC

## 2021-09-03 MED ORDER — SORBITOL 70 % SOLN: 200.0000 mL | TOPICAL_OIL | Freq: Once | ORAL | Status: DC

## 2021-09-03 MED ORDER — ACETAMINOPHEN 325 MG PO TABS: 650.0000 mg | ORAL_TABLET | Freq: Four times a day (QID) | ORAL | Status: DC | PRN

## 2021-09-03 MED ORDER — SODIUM CHLORIDE 0.9 % IV SOLN: Freq: Once | INTRAVENOUS | Status: AC

## 2021-09-03 MED ORDER — ACETAMINOPHEN 650 MG RE SUPP: 650.0000 mg | Freq: Four times a day (QID) | RECTAL | Status: DC | PRN

## 2021-09-03 MED ORDER — SODIUM CHLORIDE 0.9 % IV SOLN: INTRAVENOUS | Status: AC

## 2021-09-03 MED ORDER — ONDANSETRON HCL 4 MG PO TABS: 4.0000 mg | ORAL_TABLET | Freq: Four times a day (QID) | ORAL | Status: DC | PRN

## 2021-09-03 NOTE — ED Triage Notes (Signed)
Pt has a low sodium and abd pain with constipation  pt recently here with similar sx.   Pt alert.    ?

## 2021-09-03 NOTE — ED Provider Notes (Signed)
? ?  [facility] ?Provider Note ? ? ? Event Date/Time  ? First MD Initiated Contact with Patient 09/03/21 2149   ?  (approximate) ? ?History  ? ?Chief Complaint: abnormal labs ? ?HPI ? ?Alexander Kerr is a 56 y.o. male with a past medical history of diabetes, hypertension, recently admitted to the hospital for hyponatremia presents to the emergency department for abnormal lab work.  According to the son and patient the patient has been feeling more somnolent recently, recently saw his doctor yesterday was called today and told to go to the emergency department for evaluation due to abnormal lab work per son.  Here patient is somewhat somnolent but does awaken to voice.  He answers questions and converses with the son (patient speak Spanish).  Denies any chest pain or abdominal pain.  Has been experiencing constipation but the son states patient has been using his prescribed lactulose which is working well for the patient. ? ?Physical Exam  ? ?Triage Vital Signs: ?ED Triage Vitals  ?Enc Vitals Group  ?   BP 09/03/21 2012 (!) 134/120  ?   Pulse Rate 09/03/21 2012 75  ?   Resp 09/03/21 2012 20  ?   Temp 09/03/21 2012 98.5 ?F (36.9 ?C)  ?   Temp Source 09/03/21 2012 Oral  ?   SpO2 09/03/21 2012 100 %  ?   Weight 09/03/21 2013 135 lb (61.2 kg)  ?   Height 09/03/21 2013 5\' 2"  (1.575 m)  ?   Head Circumference --   ?   Peak Flow --   ?   Pain Score 09/03/21 2012 7  ?   Pain Loc --   ?   Pain Edu? --   ?   Excl. in GC? --   ? ? ?Most recent vital signs: ?Vitals:  ? 09/03/21 2012 09/03/21 2200  ?BP: (!) 134/120 (!) 158/81  ?Pulse: 75 73  ?Resp: 20 17  ?Temp: 98.5 ?F (36.9 ?C)   ?SpO2: 100% 100%  ? ? ?General: Awake, no distress.  ?CV:  Good peripheral perfusion.  Regular rate and rhythm  ?Resp:  Normal effort.  Equal breath sounds bilaterally.  ?Abd:  No distention.  Soft, nontender.  No rebound or guarding. ? ? ?ED Results / Procedures / Treatments  ? ?EKG ? ?EKG viewed and interpreted by myself  shows a normal sinus rhythm at 75 bpm with a narrow QRS, normal axis, normal intervals, no concerning ST changes. ? ? ?MEDICATIONS ORDERED IN ED: ?Medications  ?0.9 %  sodium chloride infusion ( Intravenous New Bag/Given 09/03/21 2156)  ? ? ? ?IMPRESSION / MDM / ASSESSMENT AND PLAN / ED COURSE  ?I reviewed the triage vital signs and the nursing notes. ? ?Patient presents to the emergency department for hyponatremia.  Patient found to be hyponatremic on her labs to a sodium 116, mildly hyperkalemic with a potassium of 5.4.  We will IV hydrate to treat both with normal saline.  Patient's COVID/flu is negative.  Remainder the chemistry is nonrevealing.  CBC is nonrevealing.  Given the patient's significant hyponatremia and weakness we will admit to the hospital service for ongoing management and further work-up.  Patient and son agreeable to plan of care. ? ?FINAL CLINICAL IMPRESSION(S) / ED DIAGNOSES  ? ?Hyponatremia ?Weakness ? ?Note:  This document was prepared using Dragon voice recognition software and may include unintentional dictation errors. ?  ?2157, MD ?09/03/21 2317 ? ?

## 2021-09-03 NOTE — ED Notes (Signed)
Bladder scan showed >200 mL urine.   ?

## 2021-09-03 NOTE — ED Notes (Signed)
Pt to room 10, ambulatory without diff or distress noted; assisted into hosp gown & on card monitor; EKG performed; Dr Lenard Lance notified of pt's Na level ?

## 2021-09-03 NOTE — H&P (Signed)
?History and Physical  ? ? ?Patient: Alexander Kerr [id number] DOB: May 12, 1966 ?DOA: 09/03/2021 ?DOS: the patient was seen and examined on 09/04/2021 ?PCP: [facility] [name]  ?Patient coming from: Home ? ?Chief Complaint:  ?Chief Complaint  ?Patient presents with  ? abnormal labs  ? ? ?HPI: Alexander Kerr is a 56 y.o. male with medical history significant of DM and diabetic retinopathy and retinal hemorrhages, coming to [place] for abnormal labs.  Patient was seen by nephrology on 218 and per note hyponatremia at that time was attributed to excessive free water intake.  Patient's last A1c was 7.5.  Patient otherwise denies any headaches nausea vomiting fevers chills chest pain shortness of breath any urinary difficulty but does report blurred vision gait difficulty or he feels that he is weak its been progressive for the past 4 months along with constipation. ? ?Review of Systems: As mentioned in the history of present illness. All other systems reviewed and are negative. ?Past Medical History:  ?Diagnosis Date  ? Diabetes mellitus without complication ([place])   ? Hypertension   ? ?History reviewed. No pertinent surgical history. ?Social History:  reports that he has quit smoking. His smoking use included cigarettes. He has never used smokeless tobacco. He reports that he does not currently use alcohol. He reports that he does not use drugs. ? ?No Known Allergies ? ?History reviewed. No pertinent family history. ? ?Prior to Admission medications   ?Medication Sig Start Date End Date Taking? Authorizing Provider  ?amLODipine (NORVASC) 10 MG tablet Take 10 mg by mouth daily. 08/14/21   [provider]  ?ketorolac (ACULAR) 0.5 % ophthalmic solution SMARTSIG:In Eye(s) 08/06/21   [provider]  ?LANTUS SOLOSTAR 100 UNIT/ML Solostar Pen Inject 10 Units into the skin 2 (two) times daily. 06/25/21   [provider]  ?levothyroxine (SYNTHROID) 50 MCG tablet Take 1 tablet (50 mcg  total) by mouth daily before breakfast. 08/26/21   Amin, Jeanella Flattery, MD  ?lisinopril (ZESTRIL) 20 MG tablet Take 20 mg by mouth 2 (two) times daily. 06/25/21   [provider]  ?omeprazole (PRILOSEC) 20 MG capsule Take 20 mg by mouth 2 (two) times daily. 08/14/21   [provider]  ?prednisoLONE acetate (PRED FORTE) 1 % ophthalmic suspension 1 drop in the morning, at noon, and at bedtime. 08/11/21   [provider]  ?VOLTAREN 1 % GEL Apply 1 application topically 4 (four) times daily as needed. 08/14/21   [provider]  ? ? ?Physical Exam: ?Vitals:  ? 09/03/21 2012 09/03/21 2013 09/03/21 2200  ?BP: (!) 134/120  (!) 158/81  ?Pulse: 75  73  ?Resp: 20  17  ?Temp: 98.5 ?F (36.9 ?C)    ?TempSrc: Oral    ?SpO2: 100%  100%  ?Weight:  61.2 kg   ?Height:  5\' 2"  (1.575 m)   ?Physical Exam ?Vitals and nursing note reviewed.  ?Constitutional:   ?   General: He is not in acute distress. ?   Appearance: Normal appearance. He is well-groomed. He is not ill-appearing, toxic-appearing or diaphoretic.  ?HENT:  ?   Head: Normocephalic and atraumatic.  ?   Right Ear: Hearing and external ear normal.  ?   Left Ear: Hearing and external ear normal.  ?   Nose: Nose normal. No nasal deformity.  ?   Mouth/Throat:  ?   Lips: Pink.  ?   Mouth: Mucous membranes are moist.  ?   Tongue: No lesions.  ?  Pharynx: Oropharynx is clear.  ?Eyes:  ?   Extraocular Movements: Extraocular movements intact.  ?   Pupils: Pupils are equal, round, and reactive to light.  ?Neck:  ?   Vascular: No carotid bruit.  ?Cardiovascular:  ?   Rate and Rhythm: Normal rate and regular rhythm.  ?   Pulses: Normal pulses.  ?   Heart sounds: Normal heart sounds.  ?Pulmonary:  ?   Effort: Pulmonary effort is normal.  ?   Breath sounds: Normal breath sounds.  ?Abdominal:  ?   General: Bowel sounds are normal. There is no distension.  ?   Palpations: Abdomen is soft. There is no mass.  ?   Tenderness: There is no abdominal tenderness. There is  no guarding.  ?   Hernia: No hernia is present.  ?Musculoskeletal:  ?   Right lower leg: No edema.  ?   Left lower leg: No edema.  ?Skin: ?   General: Skin is warm.  ?Neurological:  ?   General: No focal deficit present.  ?   Mental Status: He is alert and oriented to person, place, and time.  ?   Cranial Nerves: Cranial nerves 2-12 are intact. No cranial nerve deficit.  ?   Motor: Motor function is intact. No weakness.  ?   Gait: Gait abnormal.  ?   Deep Tendon Reflexes: Reflexes normal.  ?Psychiatric:     ?   Attention and Perception: Attention normal.     ?   Mood and Affect: Mood normal.     ?   Speech: Speech normal.     ?   Behavior: Behavior normal. Behavior is cooperative.     ?   Cognition and Memory: Cognition normal.  ? ?Data Reviewed: ?> Initial glucose of 231 > 08/22/2021: 7.5 A1c ?> CMP shows a sodium of 116, potassium of 5.4, glucose 122, albumin of 2.7, normal LFTs otherwise. ?> CBC shows a white count of 7.5 hemoglobin of 11.6 and platelets of 342. ?> Urinalysis is clear straw-colored 50 of glucose small hemoglobin 100 of protein. ?> MRI of the brain ordered for balance and gait difficulty: ?For suspicion of stroke ?> ? ? ?Assessment and Plan: ?* Acute hyponatremia- (present on admission) ?Differentials include excessive intake of free water due to thirst from diabetes, it could also include dehydration from polyuria from hyperglycemia, and he can also include hypothyroidism.  Patient does not drink.  Clinically patient is euvolemic not hypotensive not volume overloaded or edematous. ?We will admit patient for overnight evaluation and management of hyponatremia.  We will also make follow-up appointment with nephrology for his recurrent hyponatremia that is acute in nature. ?We will also obtain urine and serum osmolality then thyroid function test. ?Start patient on normal saline at 100 cc/h for the next 24 hours.  With every 2 sodium checks every 2 hours for the next 4 checks. ? ?Anemia- (present on  admission) ?Patient's hemoglobin today 11.6 today. ?We will obtain anemia panel stool guaiac and continue with IV PPI therapy. ? ?Hyperkalemia- (present on admission) ?Mild hyperkalemia with a potassium of 5.4. ?Expect to improve with IV fluids and sliding scale insulin regimen. ? ?Elevated TSH- (present on admission) ?Free T4 and TSH. ? ?Constipation- (present on admission) ?Due to patient's complaint of severe constipation we will order a smog enema for the patient. ? ? ?Hypertension- (present on admission) ?Blood pressure (!) 158/81, pulse 73, temperature 98.5 ?F (36.9 ?C), temperature source Oral, resp. rate 17, height 5\' 2"  (  1.575 m), weight 61.2 kg, SpO2 100 %. ?Patient is currently on lisinopril 20 mg twice daily. ?Patient is also on amlodipine 10 mg daily. ? ? ? ?Diabetes mellitus without complication ([place]) ?Last A1c is 7.5, patient has poorly controlled diabetes with retinopathy hemorrhages. ?Would add a p.o. antihyperglycemic agent like metformin in combination with Januvia both low-dose for treatment of patient's diabetes mellitus type 2. ? ? ? ?Advance Care Planning:   Code Status: Full Code  ? ?Consults:  ?None ? ?Family Communication:  ?Loma Messing (Son)  ?[phone] [facility] Phone) ? ?Severity of Illness: ?The appropriate patient status for this patient is OBSERVATION. Observation status is judged to be reasonable and necessary in order to provide the required intensity of service to ensure the patient's safety. The patient's presenting symptoms, physical exam findings, and initial radiographic and laboratory data in the context of their medical condition is felt to place them at decreased risk for further clinical deterioration. Furthermore, it is anticipated that the patient will be medically stable for discharge from the hospital within 2 midnights of admission.  ? ?Author: ?Para Skeans, MD ?09/04/2021 12:47 AM ? ?For on call review [email or web address].  ?

## 2021-09-04 DIAGNOSIS — R400 Somnolence: Secondary | ICD-10-CM | POA: Diagnosis present

## 2021-09-04 LAB — FOLATE: Folate: 14.9 ng/mL (ref >5.9)

## 2021-09-04 LAB — COMPREHENSIVE METABOLIC PANEL
ALT: 24 U/L (ref 0–44)
AST: 15 U/L (ref 15–41)
Albumin: 2.5 g/dL — ABNORMAL LOW (ref 3.5–5.0)
Alkaline Phosphatase: 65 U/L (ref 38–126)
Anion gap: 6 (ref 5–15)
BUN: 11 mg/dL (ref 6–20)
CO2: 24 mmol/L (ref 22–32)
Calcium: 8.1 mg/dL — ABNORMAL LOW (ref 8.9–10.3)
Chloride: 91 mmol/L — ABNORMAL LOW (ref 98–111)
Creatinine, Ser: 0.57 mg/dL — ABNORMAL LOW (ref 0.61–1.24)
GFR, Estimated: >60 mL/min (ref >60)
Glucose, Bld: 104 mg/dL — ABNORMAL HIGH (ref 70–99)
Potassium: 4.1 mmol/L (ref 3.5–5.1)
Sodium: 121 mmol/L — ABNORMAL LOW (ref 135–145)
Total Bilirubin: 0.3 mg/dL (ref 0.3–1.2)
Total Protein: 4.9 g/dL — ABNORMAL LOW (ref 6.5–8.1)

## 2021-09-04 LAB — SODIUM
Sodium: 118 mmol/L — CL (ref 135–145)
Sodium: 121 mmol/L — ABNORMAL LOW (ref 135–145)
Sodium: 125 mmol/L — ABNORMAL LOW (ref 135–145)
Sodium: 128 mmol/L — ABNORMAL LOW (ref 135–145)
Sodium: 129 mmol/L — ABNORMAL LOW (ref 135–145)

## 2021-09-04 LAB — IRON AND TIBC
Iron: 96 ug/dL (ref 45–182)
Saturation Ratios: 34 % (ref 17.9–39.5)
TIBC: 284 ug/dL (ref 250–450)
UIBC: 188 ug/dL

## 2021-09-04 LAB — CBC
HCT: 29.4 % — ABNORMAL LOW (ref 39.0–52.0)
Hemoglobin: 10.7 g/dL — ABNORMAL LOW (ref 13.0–17.0)
MCH: 30.1 pg (ref 26.0–34.0)
MCHC: 36.4 g/dL — ABNORMAL HIGH (ref 30.0–36.0)
MCV: 82.6 fL (ref 80.0–100.0)
Platelets: 332 10*3/uL (ref 150–400)
RBC: 3.56 MIL/uL — ABNORMAL LOW (ref 4.22–5.81)
RDW: 12.1 % (ref 11.5–15.5)
WBC: 7.9 10*3/uL (ref 4.0–10.5)
nRBC: 0 % (ref 0.0–0.2)

## 2021-09-04 LAB — CBG MONITORING, ED
Glucose-Capillary: 62 mg/dL — ABNORMAL LOW (ref 70–99)
Glucose-Capillary: 64 mg/dL — ABNORMAL LOW (ref 70–99)
Glucose-Capillary: 89 mg/dL (ref 70–99)
Glucose-Capillary: 94 mg/dL (ref 70–99)
Glucose-Capillary: 88 mg/dL (ref 70–99)
Glucose-Capillary: 147 mg/dL — ABNORMAL HIGH (ref 70–99)

## 2021-09-04 LAB — RETICULOCYTES
Immature Retic Fract: 3.4 % (ref 2.3–15.9)
RBC.: 3.67 MIL/uL — ABNORMAL LOW (ref 4.22–5.81)
Retic Count, Absolute: 45.5 10*3/uL (ref 19.0–186.0)
Retic Ct Pct: 1.2 % (ref 0.4–3.1)

## 2021-09-04 LAB — VITAMIN B12: Vitamin B-12: 1408 pg/mL — ABNORMAL HIGH (ref 180–914)

## 2021-09-04 LAB — TSH: TSH: 3.978 u[IU]/mL (ref 0.350–4.500)

## 2021-09-04 LAB — BRAIN NATRIURETIC PEPTIDE: B Natriuretic Peptide: 28.2 pg/mL (ref 0.0–100.0)

## 2021-09-04 LAB — GLUCOSE, CAPILLARY: Glucose-Capillary: 173 mg/dL — ABNORMAL HIGH (ref 70–99)

## 2021-09-04 LAB — T4, FREE: Free T4: 1.44 ng/dL — ABNORMAL HIGH (ref 0.61–1.12)

## 2021-09-04 LAB — OSMOLALITY: Osmolality: 246 mOsm/kg — CL (ref 275–295)

## 2021-09-04 LAB — FERRITIN: Ferritin: 173 ng/mL (ref 24–336)

## 2021-09-04 LAB — OSMOLALITY, URINE: Osmolality, Ur: 206 mOsm/kg — ABNORMAL LOW (ref 300–900)

## 2021-09-04 IMAGING — MR MR HEAD W/O CM
13 series · 48 of 48 positions shown · non-contrast
Comparison: None.

CLINICAL DATA: Acute neurologic deficit

EXAM:
MRI HEAD WITHOUT CONTRAST
TECHNIQUE: Multiplanar, multiecho pulse sequences of the brain and surrounding
structures were obtained without intravenous contrast.

[Series 5: ax dwi_tracew · axial · 3.0mm · 0.65mm/px · z∈[-40,+115]mm · 2 of 48 slices shown]
[im 1/48]
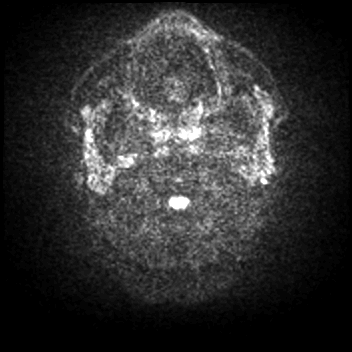
[im 48/48]
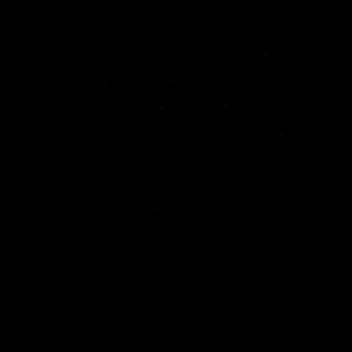

[Series 6: ax dwi_adc · axial · 3.0mm · 0.65mm/px · z∈[-40,+108]mm · 3 of 46 slices shown]
[im 1/46]
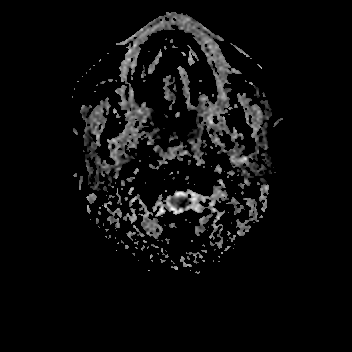
[im 23/46]
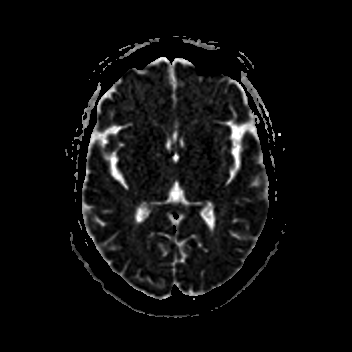
[im 46/46]
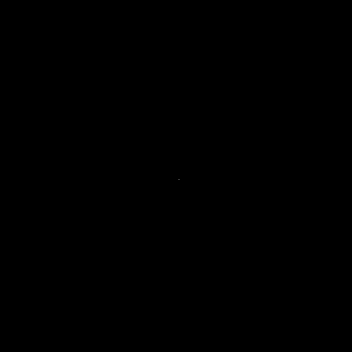

[Series 7: cor dwi_tracew · coronal · 5.0mm · 0.65mm/px · 3 of 40 slices shown]
[im 1/40]
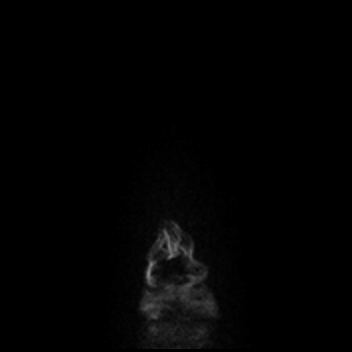
[im 20/40]
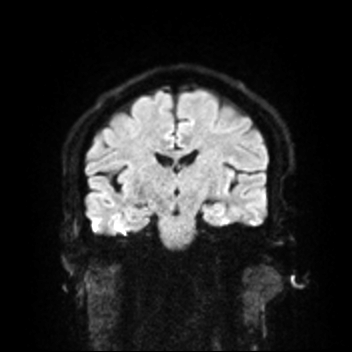
[im 40/40]
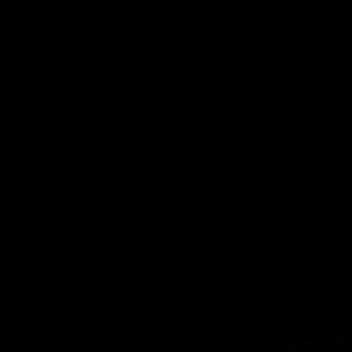

[Series 8: cor dwi_adc · coronal · 5.0mm · 0.65mm/px · 3 of 38 slices shown]
[im 1/38]
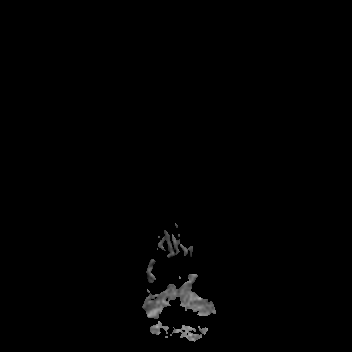
[im 19/38]
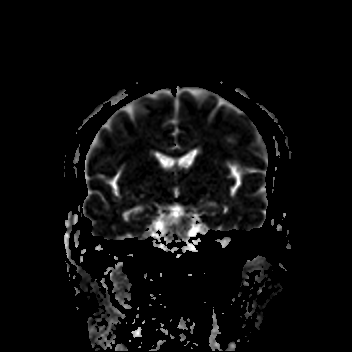
[im 38/38]
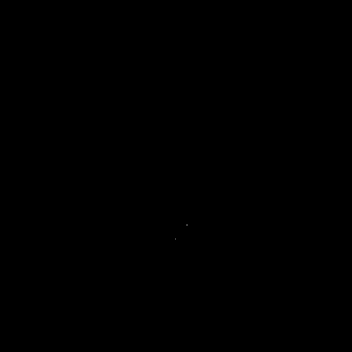

[Series 9: T1 · sagittal · 5.0mm · 0.62mm/px · 1 of 21 slices shown (1 of 2)]
[im 1/21]
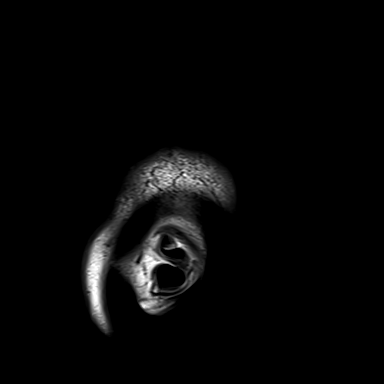

[Series 10: T2 · axial · 5.0mm · 0.53mm/px · z∈[-34,+110]mm · 2 of 25 slices shown (1 of 2)]
[im 1/25]
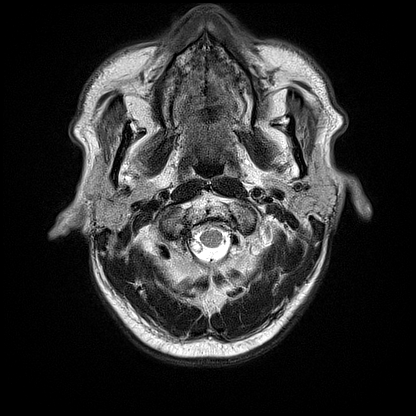
[im 25/25]
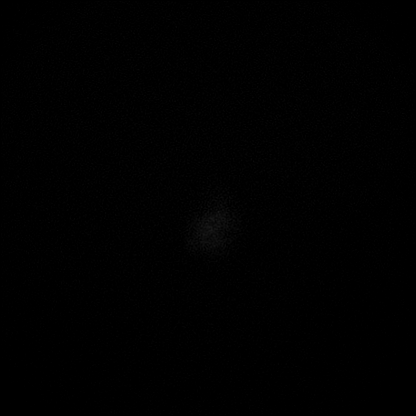

[Series 11: mag_images · axial · 3.0mm · 0.90mm/px · z∈[-51,+126]mm · 4 of 60 slices shown]
[im 1/60]
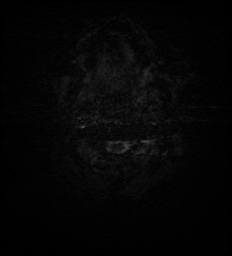
[im 20/60]
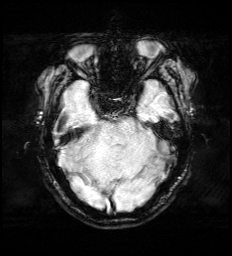
[im 40/60]
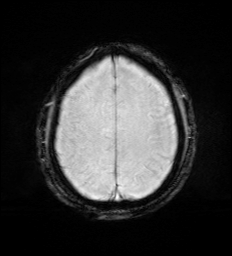
[im 60/60]
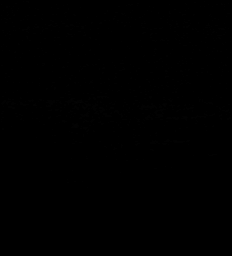

[Series 12: pha_images · axial · 3.0mm · 0.90mm/px · z∈[-51,+120]mm · 4 of 56 slices shown]
[im 1/56]
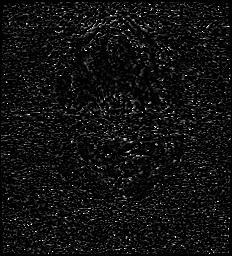
[im 19/56]
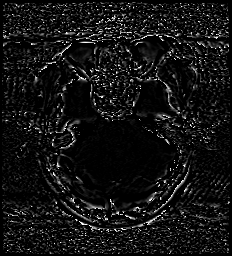
[im 37/56]
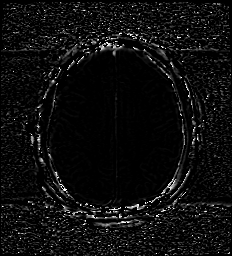
[im 56/56]
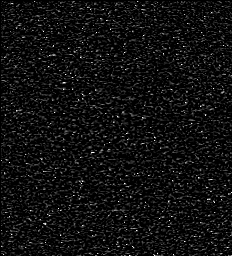

[Series 13: swi_images · axial · 3.0mm · 0.90mm/px · z∈[-51,+123]mm · 4 of 59 slices shown]
[im 1/59]
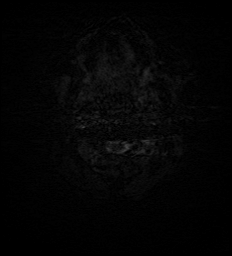
[im 20/59]
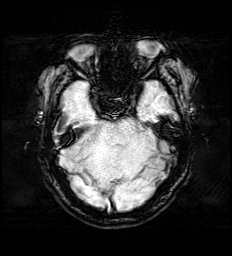
[im 39/59]
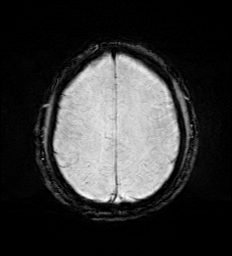
[im 59/59]
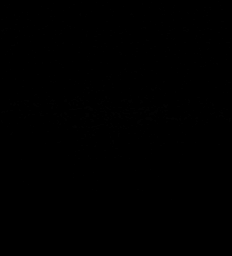

[Series 14: mip_images(sw) · axial · 24.0mm · 0.90mm/px · z∈[-40,+115]mm · 4 of 53 slices shown]
[im 1/53]
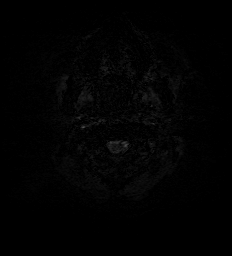
[im 18/53]
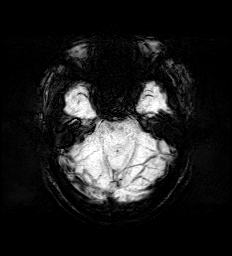
[im 35/53]
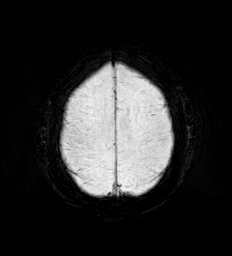
[im 53/53]
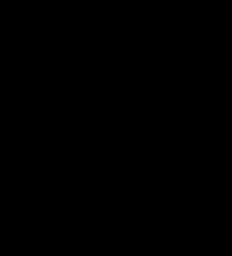

[Series 15: FLAIR · axial · 3.0mm · 0.53mm/px · z∈[-43,+119]mm · 4 of 55 slices shown]
[im 1/55]
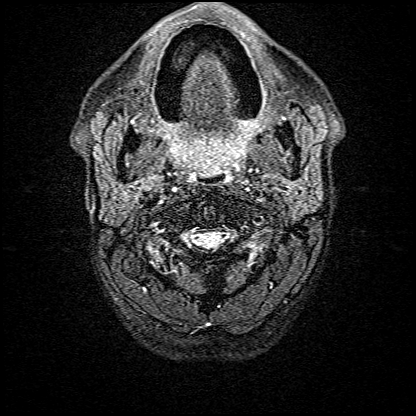
[im 19/55]
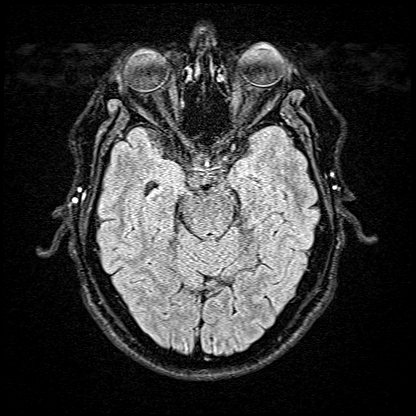
[im 37/55]
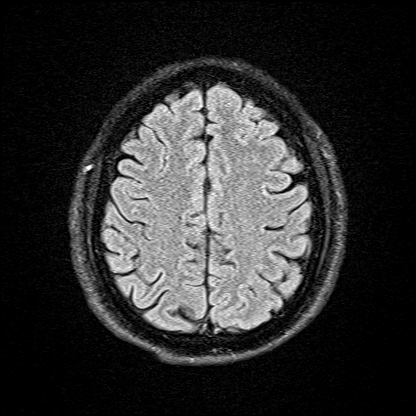
[im 55/55]
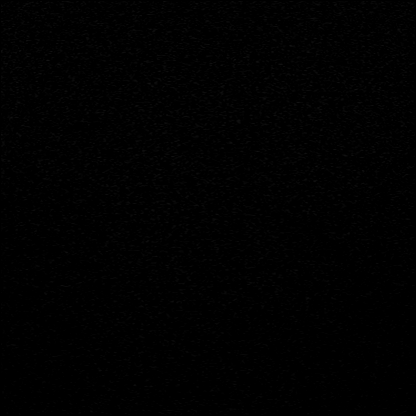

[Series 16: T1 · axial · 1.0mm · 0.98mm/px · z∈[-51,+124]mm · 12 of 170 slices shown (2 of 2)]
[im 1/170]
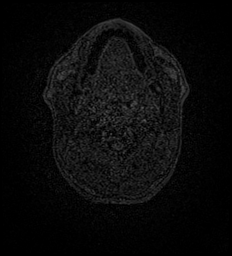
[im 16/170]
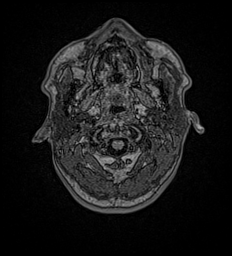
[im 31/170]
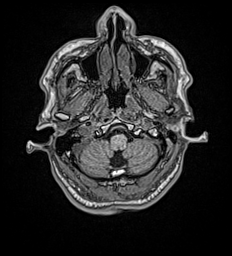
[im 47/170]
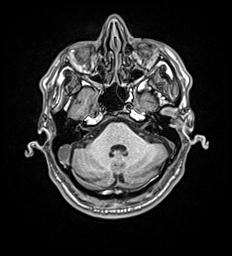
[im 62/170]
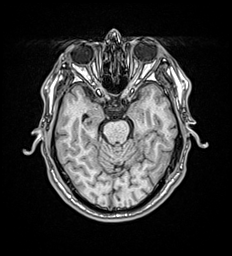
[im 77/170]
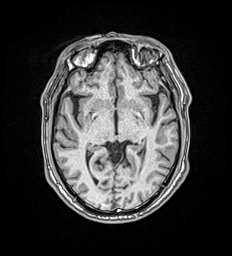
[im 93/170]
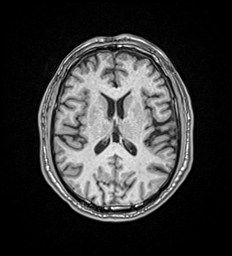
[im 108/170]
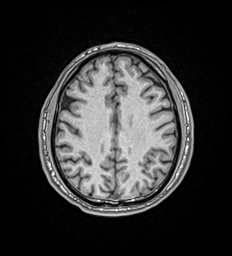
[im 123/170]
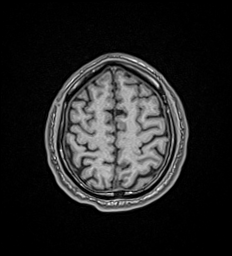
[im 139/170]
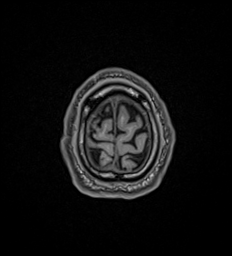
[im 154/170]
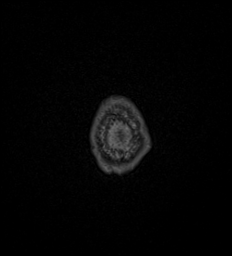
[im 170/170]
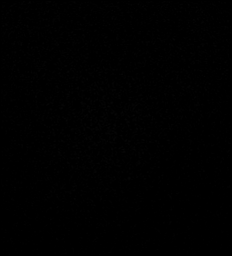

[Series 17: T2 · coronal · 5.0mm · 0.57mm/px · 2 of 29 slices shown (2 of 2)]
[im 1/29]
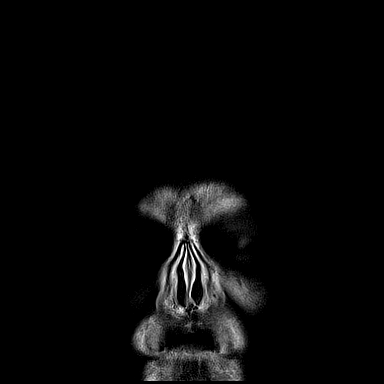
[im 29/29]
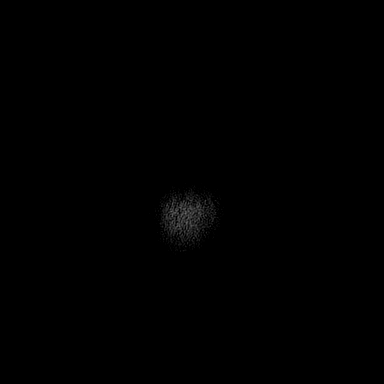

[48 of 48 positions shown; findings below may reference images not displayed]

FINDINGS: Brain: No acute infarct, mass effect or extra-axial collection. No
acute or chronic hemorrhage. Normal white matter signal, parenchymal
volume and CSF spaces. The midline structures are normal.

Vascular: Major flow voids are preserved.

Skull and upper cervical spine: Normal calvarium and skull base.
Visualized upper cervical spine and soft tissues are normal.

Sinuses/Orbits:No paranasal sinus fluid levels or advanced mucosal
thickening. No mastoid or middle ear effusion. Normal orbits.
IMPRESSION: Normal brain MRI.

## 2021-09-04 MED ORDER — PANTOPRAZOLE SODIUM 40 MG PO TBEC: 40.0000 mg | DELAYED_RELEASE_TABLET | Freq: Every day | ORAL | Status: DC

## 2021-09-04 MED ORDER — AMLODIPINE BESYLATE 10 MG PO TABS
10.0000 mg | ORAL_TABLET | Freq: Every day | ORAL | Status: DC
  Administered 2021-09-04 – 2021-09-05 (×2): 10 mg via ORAL
  Filled 2021-09-04: qty 2
  Filled 2021-09-04: qty 1

## 2021-09-04 MED ORDER — SODIUM CHLORIDE 1 G PO TABS
1.0000 g | ORAL_TABLET | Freq: Three times a day (TID) | ORAL | Status: DC
  Administered 2021-09-04 – 2021-09-05 (×4): 1 g via ORAL
  Filled 2021-09-04 (×6): qty 1

## 2021-09-04 MED ORDER — PREDNISOLONE ACETATE 1 % OP SUSP
1.0000 [drp] | Freq: Three times a day (TID) | OPHTHALMIC | Status: DC
  Administered 2021-09-04 – 2021-09-05 (×3): 1 [drp] via OPHTHALMIC
  Filled 2021-09-04: qty 5
  Filled 2021-09-04: qty 1

## 2021-09-04 MED ORDER — HEPARIN SODIUM (PORCINE) 5000 UNIT/ML IJ SOLN
5000.0000 [IU] | Freq: Two times a day (BID) | INTRAMUSCULAR | Status: DC
  Administered 2021-09-04 – 2021-09-05 (×3): 5000 [IU] via SUBCUTANEOUS
  Filled 2021-09-04 (×3): qty 1

## 2021-09-04 MED ORDER — BISACODYL 10 MG RE SUPP
10.0000 mg | Freq: Every day | RECTAL | Status: DC
  Administered 2021-09-04: 10 mg via RECTAL
  Filled 2021-09-04 (×2): qty 1

## 2021-09-04 MED ORDER — PANTOPRAZOLE SODIUM 40 MG IV SOLR
40.0000 mg | INTRAVENOUS | Status: DC
  Administered 2021-09-04 – 2021-09-05 (×2): 40 mg via INTRAVENOUS
  Filled 2021-09-04 (×2): qty 10

## 2021-09-04 MED ORDER — LEVOTHYROXINE SODIUM 50 MCG PO TABS
50.0000 ug | ORAL_TABLET | Freq: Every day | ORAL | Status: DC
  Administered 2021-09-04 – 2021-09-05 (×2): 50 ug via ORAL
  Filled 2021-09-04 (×2): qty 1

## 2021-09-04 MED ORDER — DICLOFENAC SODIUM 1 % EX GEL
1.0000 "application " | Freq: Four times a day (QID) | CUTANEOUS | Status: DC | PRN
  Filled 2021-09-04: qty 100

## 2021-09-04 MED ORDER — LACTULOSE 10 GM/15ML PO SOLN: 30.0000 g | Freq: Two times a day (BID) | ORAL | Status: DC | PRN

## 2021-09-04 MED ORDER — KETOROLAC TROMETHAMINE 0.5 % OP SOLN
1.0000 [drp] | Freq: Four times a day (QID) | OPHTHALMIC | Status: DC
  Administered 2021-09-05: 1 [drp] via OPHTHALMIC
  Filled 2021-09-04 (×2): qty 3

## 2021-09-04 MED ORDER — LISINOPRIL 20 MG PO TABS
20.0000 mg | ORAL_TABLET | Freq: Two times a day (BID) | ORAL | Status: DC
  Administered 2021-09-04 – 2021-09-05 (×3): 20 mg via ORAL
  Filled 2021-09-04: qty 1
  Filled 2021-09-04: qty 2
  Filled 2021-09-04: qty 1

## 2021-09-04 NOTE — Assessment & Plan Note (Addendum)
Resolved ?Recent Labs  ?Lab 09/03/21 ?2014 09/04/21 ?0510  ?K 5.4* 4.1  ? ?

## 2021-09-04 NOTE — Assessment & Plan Note (Addendum)
Ongoing chronic issue for his taking lactulose.  Continue on aggressive bowel regimen/enema.  ?

## 2021-09-04 NOTE — Progress Notes (Signed)
PROGRESS NOTE Alexander Kerr  GLO:[id number] DOB: February 19, 1966 DOA: 09/03/2021 PCP: [facility]   Brief Narrative/Hospital Course: 56 y.o. 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  As per the history patient has been feeling somnolent recently saw her doctor and lab work were done and directed to come to the ED for low sodium level. Patient's somnolent in the ED awaken to voice answer questions and converses with the son speaks in Spanish no chest pain abdominal pain but having constipation and was getting lactulose which is working well In the ED sodium 116 potassium 5.4 given IV fluids COVID-19 negative on panel negative stable B12 osmolality 246, CBC with chronic anemia hemoglobin 10.7 g, MRI brain negative.  Patient is admitted on IV fluids for further management.    Subjective: Seen and examined this morning.  Patient is alert awake oriented communicative interactive.Sodium slightly coming out but still low. requesting to use the bedside commode.  Assessment and Plan: * Hyponatremia Recent admission for similar hyponatremia-what she got tolvaptan but needed D5 water for overcorrection and was treated with fluid restriction salt tablets since he behaved with SIADH.  Sodium slightly coming up on IV fluids, I will reconsult nephrology trend sodium level frequently.  Resume salt tablet Recent Labs  Lab 09/03/21 2014 09/04/21 0108  09/04/21 0510 09/04/21 0640  NA 116* 118* 121* 121*    Somnolence MRI brain negative.  Mental state stable improved communicative.  Anemia Hemoglobin stable appears chronic.  No indication to pursue further work-up here needs outpatient PCP follow-up   Recent Labs  Lab 09/03/21 2014 09/04/21 0510  HGB 11.6* 10.7*  HCT 32.0* 29.4*    Hyperkalemia Resolved Recent Labs  Lab 09/03/21 2014 09/04/21 0510  K 5.4* 4.1    Hypothyroidism Resume home Synthroid.  She was recently placed on thyroid supplement for abnormal thyroid function  Constipation Ongoing chronic issue for his taking lactulose.  Continue on aggressive bowel regimen/enema.   Hypertension Blood pressure borderline/elevated we will resume patient's amlodipine lisinopril   Diabetes mellitus without complication (HCC) Last A1c is 7.5.  Patient hyperglycemic here.  Hold home meds, encourage diet if recurrent hypoglycemia and dextrose ivf-but with hyponatremia will need to be careful.  Changed to regular diet Recent Labs  Lab 09/04/21 0107 09/04/21 0115 09/04/21 0205 09/04/21 0724  GLUCAP 64* 62* 89 94     DVT prophylaxis: heparin injection 5,000 Units Start: 09/04/21 1000 SCDs Start: 09/03/21 2328 Code Status:   Code Status: Full Code Family Communication: plan of care discussed with patient at bedside.  Disposition: Currently not  medically stable for discharge. Status is: Observation The patient will require care spanning > 2 midnights and should be moved to inpatient because: Due to ongoing hyponatremia and need for further management  Objective: Vitals last 24 hrs: Vitals:   09/04/21 0830 09/04/21 0906 09/04/21 0930 09/04/21 1000  BP: (!) 158/74 139/76 (!) 145/67 (!) 153/68  Pulse: 74  72 74  Resp: 20  15 13   Temp:  TempSrc:      SpO2: 100%  100% 100%  Weight:      Height:       Weight change:   Physical Examination: General exam: AAO at baseline,older than stated age, weak  appearing. HEENT:Oral mucosa moist, Ear/Nose WNL grossly, dentition normal. Respiratory system: bilaterally diminished BS, no use of accessory muscle Cardiovascular system: S1 & S2 +, No JVD,. Gastrointestinal system: Abdomen soft,NT,ND, BS+ Nervous System:Alert, awake, moving extremities and grossly nonfocal Extremities: LE edema  none,distal peripheral pulses palpable.  Skin: No rashes,no icterus. MSK: Normal muscle bulk,tone, power  Medications reviewed:  Scheduled Meds:  amLODipine  10 mg Oral Daily   bisacodyl  10 mg Rectal QHS   heparin  5,000 Units Subcutaneous Q12H   insulin aspart  0-5 Units Subcutaneous QHS   insulin aspart  0-6 Units Subcutaneous TID WC   [START ON 09/05/2021] ketorolac  1 drop Both Eyes QID   levothyroxine  50 mcg Oral Q0600   lisinopril  20 mg Oral BID   pantoprazole (PROTONIX) IV  40 mg Intravenous Q24H   prednisoLONE acetate  1 drop Both Eyes TID   sodium chloride  1 g Oral TID WC   sorbitol, milk of mag, mineral oil, glycerin (SMOG) enema  200 mL Rectal Once   Continuous Infusions:  sodium chloride 100 mL/hr at 09/04/21 1015      Diet Order             Diet regular Room service appropriate? Yes; Fluid consistency: Thin  Diet effective now                            Intake/Output Summary (Last 24 hours) at 09/04/2021 1139 Last data filed at 09/04/2021 0112 Gross per 24 hour  Intake 326.67 ml  Output --  Net 326.67 ml   Net IO Since Admission: 326.67 mL [09/04/21 1139]  Wt Readings from Last 3 Encounters:  09/03/21 61.2 kg  08/22/21 61.2 kg     Unresulted Labs (From admission, onward)     Start     Ordered   09/04/21 0815  Sodium  Now then every 6 hours,   STAT      09/04/21 0815          Data Reviewed: I have personally reviewed following labs and imaging studies CBC: Recent Labs  Lab 09/03/21 2014 09/04/21 0510  WBC 7.5 7.9  HGB 11.6* 10.7*  HCT 32.0* 29.4*  MCV 82.9 82.6  PLT 342 332   Basic Metabolic  Panel: Recent Labs  Lab 09/03/21 2014 09/04/21 0108 09/04/21 0510 09/04/21 0640 09/04/21 1004  NA 116* 118* 121* 121* 125*  K 5.4*  --  4.1  --   --   CL 84*  --  91*  --   --   CO2 24  --  24  --   --   GLUCOSE 122*  --  104*  --   --   BUN 12  --  11  --   --   CREATININE 0.61  --  0.57*  --   --   CALCIUM 8.4*  --  8.1*  --   --    GFR: Estimated Creatinine Clearance: 78.7 mL/min (A) (by C-G formula based on SCr of 0.57 mg/dL (L)). Liver Function Tests: Recent Labs  Lab 09/03/21 2014 09/04/21 0510  AST 17 15  ALT 27 24  ALKPHOS 68 65  BILITOT 0.4 0.3  PROT 5.3* 4.9*  ALBUMIN 2.7* 2.5*   Recent Labs  Lab 09/03/21 2014  LIPASE 45   No results for input(s): AMMONIA in the last 168 hours. Coagulation Profile: No results for input(s): INR, PROTIME in the last 168 hours. Cardiac Enzymes: No results for input(s): CKTOTAL, CKMB, CKMBINDEX, TROPONINI in the last 168 hours. BNP (last 3 results) No results for input(s): PROBNP in the last 8760 hours. HbA1C: No results for input(s): HGBA1C in the last 72 hours. CBG: Recent Labs  Lab 09/04/21 0107 09/04/21 0115 09/04/21 0205 09/04/21 0724 09/04/21 1115  GLUCAP 64* 62* 89 94 88   Lipid Profile: No results for input(s): CHOL, HDL, LDLCALC, TRIG, CHOLHDL, LDLDIRECT in the last 72 hours. Thyroid Function Tests: Recent Labs    09/03/21 2014  TSH 3.978  FREET4 1.44*   Anemia Panel: Recent Labs    09/04/21 0108  VITAMINB12 1,408*  FOLATE 14.9  FERRITIN 173  TIBC 284  IRON 96  RETICCTPCT 1.2   Sepsis Labs: No results for input(s): PROCALCITON, LATICACIDVEN in the last 168 hours.  Recent Results (from the past 240 hour(s))  Resp Panel by RT-PCR (Flu A&B, Covid) Nasopharyngeal Swab     Status: None   Collection Time: 09/03/21  9:57 PM   Specimen: Nasopharyngeal Swab; Nasopharyngeal(NP) swabs in vial transport medium  Result Value Ref Range Status   SARS Coronavirus 2 by RT PCR NEGATIVE NEGATIVE Final     Comment: (NOTE) SARS-CoV-2 target nucleic acids are NOT DETECTED.  The SARS-CoV-2 RNA is generally detectable in upper respiratory specimens during the acute phase of infection. The lowest concentration of SARS-CoV-2 viral copies this assay can detect is 138 copies/mL. A negative result does not preclude SARS-Cov-2 infection and should not be used as the sole basis for treatment or other patient management decisions. A negative result may occur with  improper specimen collection/handling, submission of specimen other than nasopharyngeal swab, presence of viral mutation(s) within the areas targeted by this assay, and inadequate number of viral copies(<138 copies/mL). A negative result must be combined with clinical observations, patient history, and epidemiological information. The expected result is Negative.  Fact Sheet for Patients:  BloggerCourse.com  Fact Sheet for Healthcare Providers:  SeriousBroker.it  This test is no t yet approved or cleared by the [place] FDA and  has been authorized for detection and/or diagnosis of SARS-CoV-2 by FDA under an Emergency Use Authorization (EUA). This EUA will remain  in effect (meaning this test can be used) for the duration of the COVID-19 declaration under Section 564(b)(1) of the Act, 21 U.S.C.section 360bbb-3(b)(1), unless the authorization is terminated  or revoked sooner.       Influenza A by PCR NEGATIVE NEGATIVE Final   Influenza B by PCR NEGATIVE NEGATIVE Final    Comment: (NOTE) The Xpert Xpress SARS-CoV-2/FLU/RSV plus assay is intended as an aid in the diagnosis of influenza from Nasopharyngeal swab specimens and should not be used as a sole basis for treatment. Nasal washings and aspirates are unacceptable for Xpert Xpress SARS-CoV-2/FLU/RSV testing.  Fact Sheet for Patients: BloggerCourse.com  Fact Sheet for Healthcare  Providers: SeriousBroker.it  This test is not yet approved or cleared by the [place] FDA and has been authorized for detection and/or diagnosis of SARS-CoV-2 by FDA under an Emergency Use Authorization (EUA). This EUA will remain in effect (meaning this test can be used) for the duration of the COVID-19 declaration under Section 564(b)(1) of the Act, 21 U.S.C. section 360bbb-3(b)(1), unless the authorization  is terminated or revoked.  Performed at [facility], [address] [place], [place] [address]     Antimicrobials: Anti-infectives (From admission, onward)    None      Culture/Microbiology No results found for: SDES, SPECREQUEST, CULT, REPTSTATUS  Other culture-see note  Radiology Studies: MR BRAIN WO CONTRAST  Result Date: 09/04/2021 CLINICAL DATA:  Acute neurologic deficit EXAM: MRI HEAD WITHOUT CONTRAST TECHNIQUE: Multiplanar, multiecho pulse sequences of the brain and surrounding structures were obtained without intravenous contrast. COMPARISON:  None. FINDINGS: Brain: No acute infarct, mass effect or extra-axial collection. No acute or chronic hemorrhage. Normal white matter signal, parenchymal volume and CSF spaces. The midline structures are normal. Vascular: Major flow voids are preserved. Skull and upper cervical spine: Normal calvarium and skull base. Visualized upper cervical spine and soft tissues are normal. Sinuses/Orbits:No paranasal sinus fluid levels or advanced mucosal thickening. No mastoid or middle ear effusion. Normal orbits. IMPRESSION: Normal brain MRI. Electronically Signed   By: Deatra RobinsonKevin  Herman M.D.   On: 09/04/2021 00:47     LOS: 0 days   Lanae Boastamesh Tangia Pinard, MD [facility]  09/04/2021, 11:39 AM

## 2021-09-04 NOTE — Assessment & Plan Note (Addendum)
Recent admission for similar hyponatremia.  Sodium now improved with IV fluid, fluid restriction and salt tablets.  Appreciate nephrology input.  Advise close follow-up with nephrology ?Recent Labs  ?Lab 09/04/21 ?1004 09/04/21 ?1541 09/04/21 ?2317 09/05/21 ?5916 09/05/21 ?0708  ?NA 125* 128* 129* 129* 130*  ? ?

## 2021-09-04 NOTE — Assessment & Plan Note (Addendum)
MRI brain negative.  Mental state stable improved communicative. ?

## 2021-09-04 NOTE — Assessment & Plan Note (Addendum)
contr home Synthroid.he was recently placed on thyroid supplement for abnormal thyroid function ?

## 2021-09-04 NOTE — Evaluation (Signed)
Physical Therapy Evaluation ?Patient Details ?Name: Alexander Kerr ?MRN: [id number] ?DOB: Apr 21, 1966 ?Today's Date: 09/04/2021 ? ?History of Present Illness ? Pt is 56 y.o. male with medical history significant of DM and diabetic retinopathy and retinal hemorrhages, hypertension, with a recent admission 2/17 to 2/21 for hyponatremia. ? ?  ?Clinical Impression ? Pt received in supine position and agreeable to therapy.  Pt performed all bed-level exercises with god technique and no complications.  Pt performed transfers then with no assistance required and was able to ambulate 300 feet without much difficulty.  Pt did stagger a little at times, but he reports it is because his shoes were not on all the way.  Once corrected, pt was able to ambulate with no complication at all and felt close to baseline.  Patient is at baseline, all education completed, and time is given to address all questions/concerns. No additional skilled PT services needed at this time, PT signing off. PT recommends daily ambulation ad lib or with nursing staff as needed to prevent deconditioning.  ?  ? ?Translator services utilized: Chrissie Noa - [phone]  ? ?Recommendations for follow up therapy are one component of a multi-disciplinary discharge planning process, led by the attending physician.  Recommendations may be updated based on patient status, additional functional criteria and insurance authorization. ? ?Follow Up Recommendations No PT follow up ? ?  ?Assistance Recommended at Discharge None  ?Patient can return home with the following ?   ? ?  ?Equipment Recommendations None recommended by PT  ?Recommendations for Other Services ?    ?  ?Functional Status Assessment Patient has not had a recent decline in their functional status  ? ?  ?Precautions / Restrictions Precautions ?Precautions: None ?Restrictions ?Weight Bearing Restrictions: No  ? ?  ? ?Mobility ? Bed Mobility ?Overal bed mobility: Independent ?  ?  ?  ?  ?  ?  ?  ?   ? ?Transfers ?Overall transfer level: Independent ?  ?  ?  ?  ?  ?  ?  ?  ?  ?  ? ?Ambulation/Gait ?Ambulation/Gait assistance: Independent ?Gait Distance (Feet): 300 Feet ?Assistive device: None ?Gait Pattern/deviations: WFL(Within Functional Limits) ?Gait velocity: decreased ?  ?  ?General Gait Details: Staggered at times, but he reports that it is due to his shoes not being on the entire way. ? ?Stairs ?  ?  ?  ?  ?  ? ?Wheelchair Mobility ?  ? ?Modified Rankin (Stroke Patients Only) ?  ? ?  ? ?Balance Overall balance assessment: Independent ?  ?  ?  ?  ?  ?  ?  ?  ?  ?  ?  ?  ?  ?  ?  ?  ?  ?  ?   ? ? ? ?Pertinent Vitals/Pain Pain Assessment ?Pain Assessment: 0-10 ?Pain Score: 5  ?Pain Location: Stomach ?Pain Descriptors / Indicators: Heaviness ?Pain Intervention(s): Limited activity within patient's tolerance, Monitored during session, Premedicated before session  ? ? ?Home Living Family/patient expects to be discharged to:: Private residence ?Living Arrangements: Children ?Available Help at Discharge: Family;Available 24 hours/day ?Type of Home: Mobile home ?Home Access: Stairs to enter ?Entrance Stairs-Rails: Right;Left;Can reach both ?Entrance Stairs-Number of Steps: 5 ?  ?Home Layout: One level ?Home Equipment: None ?   ?  ?Prior Function Prior Level of Function : Independent/Modified Independent ?  ?  ?  ?  ?  ?  ?  ?  ?  ? ? ?Hand Dominance  ?  Dominant Hand: Right ? ?  ?Extremity/Trunk Assessment  ? Upper Extremity Assessment ?Upper Extremity Assessment: Overall WFL for tasks assessed ?  ? ?Lower Extremity Assessment ?Lower Extremity Assessment: Overall WFL for tasks assessed ?  ? ?   ?Communication  ? Communication: Prefers language other than [place];Interpreter utilized Chrissie Noa [phone])  ?Cognition Arousal/Alertness: Awake/alert ?Behavior During Therapy: [facility] for tasks assessed/performed ?Overall Cognitive Status: Within Functional Limits for tasks assessed ?  ?  ?  ?  ?  ?  ?  ?  ?  ?  ?  ?  ?  ?  ?   ?  ?  ?  ?  ? ?  ?General Comments   ? ?  ?Exercises Total Joint Exercises ?Ankle Circles/Pumps: AROM, Strengthening, Both, 10 reps, Supine ?Quad Sets: AROM, Strengthening, Both, 10 reps, Supine ?Gluteal Sets: AROM, Strengthening, Both, 10 reps, Supine ?Hip ABduction/ADduction: AROM, Strengthening, Both, 10 reps, Supine ?Straight Leg Raises: AROM, Strengthening, Both, 10 reps, Supine ?Long Arc Quad: AROM, Strengthening, Both, 10 reps, Seated ?Marching in Standing: AROM, Strengthening, Both, 10 reps, Standing ?Other Exercises ?Other Exercises: Pt educated on roles of PT and services provided during hospital stay.  ? ?Assessment/Plan  ?  ?PT Assessment Patient does not need any further PT services  ?PT Problem List   ? ?   ?  ?PT Treatment Interventions     ? ?PT Goals (Current goals can be found in the Care Plan section)  ?Acute Rehab PT Goals ?Patient Stated Goal: to get better. ?PT Goal Formulation: With patient ?Time For Goal Achievement: 09/18/21 ?Potential to Achieve Goals: Good ? ?  ?Frequency   ?  ? ? ?Co-evaluation   ?  ?  ?  ?  ? ? ?  ?AM-PAC PT "6 Clicks" Mobility  ?Outcome Measure Help needed turning from your back to your side while in a flat bed without using bedrails?: None ?Help needed moving from lying on your back to sitting on the side of a flat bed without using bedrails?: None ?Help needed moving to and from a bed to a chair (including a wheelchair)?: None ?Help needed standing up from a chair using your arms (e.g., wheelchair or bedside chair)?: None ?Help needed to walk in hospital room?: None ?Help needed climbing 3-5 steps with a railing? : None ?6 Click Score: 24 ? ?  ?End of Session Equipment Utilized During Treatment: Gait belt ?Activity Tolerance: Patient tolerated treatment well ?Patient left: in bed;with call bell/phone within reach ?Nurse Communication: Mobility status ?  ?  ? ?Time: 5361-4431 ?PT Time Calculation (min) (ACUTE ONLY): 26 min ? ? ?Charges:     ?PT Treatments ?$Gait  Training: 8-22 mins ?$Therapeutic Exercise: 8-22 mins ?  ?   ? ? ?Nolon Bussing, PT, DPT ?09/04/21, 2:45 PM ? ? ?Phineas Real ?09/04/2021, 2:39 PM ? ?

## 2021-09-04 NOTE — Assessment & Plan Note (Addendum)
Blood pressure at this time is stable continue home meds amlodipine lisinopril ? ?

## 2021-09-04 NOTE — ED Notes (Signed)
Transport requested

## 2021-09-04 NOTE — ED Notes (Signed)
Pt reports some mild nausea after eating lunch. PRN given.  ?

## 2021-09-04 NOTE — Hospital Course (Addendum)
56 y.o. 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  As per the history patient has been feeling somnolent recently saw her doctor and lab work were done and directed to come to the ED for low sodium level. ?Patient's somnolent in the ED awaken to voice answer questions and converses with the son speaks in Spanish no chest pain abdominal pain but having constipation and was getting lactulose which is working well.In the ED sodium 116 potassium 5.4 given IV fluids COVID-19 negative on panel negative stable B12 osmolality 246, CBC with chronic anemia hemoglobin 10.7 g, MRI brain negative.  Patient was admitted on IV fluids for further management. ?Seen by nephrology, kept on IV fluid hydration with serial monitoring of sodium level and has improved to 130 at this time.  Continue fluid restriction salt tablet. ?We will plan for discharge home once cleared by nephrology. ?

## 2021-09-04 NOTE — Assessment & Plan Note (Addendum)
Hemoglobin stable appears chronic.  No indication to pursue further work-up here needs outpatient PCP follow-up   ?Recent Labs  ?Lab 09/03/21 ?2014 09/04/21 ?0510  ?HGB 11.6* 10.7*  ?HCT 32.0* 29.4*  ? ?

## 2021-09-04 NOTE — Assessment & Plan Note (Addendum)
Last A1c is 7.5 on 08/22/21. Patient hypoglycemic here-now blood sugar improved.  Continue diet.  Resume home meds on discharge and watch for hypoglycemia at home ?Recent Labs  ?Lab 09/04/21 ?0205 09/04/21 ?0724 09/04/21 ?1115 09/04/21 ?1647 09/04/21 ?2316  ?GLUCAP 89 94 88 147* 173*  ? ?

## 2021-09-04 NOTE — ED Notes (Signed)
Pt provided juice and a sandwich tray at this time. Pt is alert and oriented times 4 ? ?

## 2021-09-04 NOTE — ED Notes (Signed)
Pt taken to MRI at this time

## 2021-09-04 NOTE — Progress Notes (Signed)
[facility]  ROUNDING NOTE   Subjective:   Alexander Kerr is a 56 year old Hispanic male, Spanish-speaking, with past medical histories including diabetes, hypertension, hyponatremia.He presents to the emergency department with abnormal labs.  He will be admitted for Hyponatremia [E87.1]  Patient is known to our practice from recent previous admission for hyponatremia.  Presentation remains similar with patient complaining of abdominal pain and constipation.  He was found to have nonobstructive ileus at that time along with hypothyroidism.  He began replacement therapy for thyroid disorder and given supportive care for constipation.  At time of discharge sodium level was 130 on 08/26/2021.  On ED arrival sodium level 116.  Patient continues to complain of abdominal pain and constipation.  Reports he has continued to take thyroid medication.   Objective:  Vital signs in last 24 hours:  Temp:  [98.5 F (36.9 C)] 98.5 F (36.9 C) (03/01 2012) Pulse Rate:  [72-78] 74 (03/02 1335) Resp:  [12-22] 15 (03/02 1335) BP: (124-162)/(54-120) 126/59 (03/02 1335) SpO2:  [99 %-100 %] 99 % (03/02 1335) Weight:  [61.2 kg] 61.2 kg (03/01 2013)  Weight change:  Filed Weights   09/03/21 2013  Weight: 61.2 kg    Intake/Output:   Intake/Output this shift:  No intake/output data recorded.  Physical Exam: General: No acute distress  Head: Normocephalic, atraumatic. Moist oral mucosal membranes  Eyes: Anicteric  Lungs:  Clear to auscultation, normal effort  Heart: S1S2 no rubs  Abdomen:  Soft, nontender, bowel sounds present  Extremities: No peripheral edema  Neurologic: Awake, alert, following commands  Skin: No acute rash  Access: No access    Basic Metabolic Panel: Recent Labs  Lab 09/03/21 2014 09/04/21 0108 09/04/21 0510 09/04/21 0640 09/04/21 1004  NA 116* 118* 121* 121* 125*  K 5.4*  --  4.1  --   --   CL 84*  --  91*  --   --   CO2 24  --  24  --   --    GLUCOSE 122*  --  104*  --   --   BUN 12  --  11  --   --   CREATININE 0.61  --  0.57*  --   --   CALCIUM 8.4*  --  8.1*  --   --      Liver Function Tests: Recent Labs  Lab 09/03/21 2014 09/04/21 0510  AST 17 15  ALT 27 24  ALKPHOS 68 65  BILITOT 0.4 0.3  PROT 5.3* 4.9*  ALBUMIN 2.7* 2.5*    Recent Labs  Lab 09/03/21 2014  LIPASE 45    No results for input(s): AMMONIA in the last 168 hours.  CBC: Recent Labs  Lab 09/03/21 2014 09/04/21 0510  WBC 7.5 7.9  HGB 11.6* 10.7*  HCT 32.0* 29.4*  MCV 82.9 82.6  PLT 342 332     Cardiac Enzymes: No results for input(s): CKTOTAL, CKMB, CKMBINDEX, TROPONINI in the last 168 hours.  BNP: Invalid input(s): POCBNP  CBG: Recent Labs  Lab 09/04/21 0107 09/04/21 0115 09/04/21 0205 09/04/21 0724 09/04/21 1115  GLUCAP 64* 62* 89 94 88     Microbiology: Results for orders placed or performed during the hospital encounter of 09/03/21  Resp Panel by RT-PCR (Flu A&B, Covid) Nasopharyngeal Swab     Status: None   Collection Time: 09/03/21  9:57 PM   Specimen: Nasopharyngeal Swab; Nasopharyngeal(NP) swabs in vial transport medium  Result Value Ref Range Status   SARS  Coronavirus 2 by RT PCR NEGATIVE NEGATIVE Final    Comment: (NOTE) SARS-CoV-2 target nucleic acids are NOT DETECTED.  The SARS-CoV-2 RNA is generally detectable in upper respiratory specimens during the acute phase of infection. The lowest concentration of SARS-CoV-2 viral copies this assay can detect is 138 copies/mL. A negative result does not preclude SARS-Cov-2 infection and should not be used as the sole basis for treatment or other patient management decisions. A negative result may occur with  improper specimen collection/handling, submission of specimen other than nasopharyngeal swab, presence of viral mutation(s) within the areas targeted by this assay, and inadequate number of viral copies(<138 copies/mL). A negative result must be combined  with clinical observations, patient history, and epidemiological information. The expected result is Negative.  Fact Sheet for Patients:  EntrepreneurPulse.com.au  Fact Sheet for Healthcare Providers:  IncredibleEmployment.be  This test is no t yet approved or cleared by the [place] FDA and  has been authorized for detection and/or diagnosis of SARS-CoV-2 by FDA under an Emergency Use Authorization (EUA). This EUA will remain  in effect (meaning this test can be used) for the duration of the COVID-19 declaration under Section 564(b)(1) of the Act, 21 U.S.C.section 360bbb-3(b)(1), unless the authorization is terminated  or revoked sooner.       Influenza A by PCR NEGATIVE NEGATIVE Final   Influenza B by PCR NEGATIVE NEGATIVE Final    Comment: (NOTE) The Xpert Xpress SARS-CoV-2/FLU/RSV plus assay is intended as an aid in the diagnosis of influenza from Nasopharyngeal swab specimens and should not be used as a sole basis for treatment. Nasal washings and aspirates are unacceptable for Xpert Xpress SARS-CoV-2/FLU/RSV testing.  Fact Sheet for Patients: EntrepreneurPulse.com.au  Fact Sheet for Healthcare Providers: IncredibleEmployment.be  This test is not yet approved or cleared by the [place] FDA and has been authorized for detection and/or diagnosis of SARS-CoV-2 by FDA under an Emergency Use Authorization (EUA). This EUA will remain in effect (meaning this test can be used) for the duration of the COVID-19 declaration under Section 564(b)(1) of the Act, 21 U.S.C. section 360bbb-3(b)(1), unless the authorization is terminated or revoked.  Performed at [facility], [address] [place], [place] [address]     Coagulation Studies: No results for input(s): LABPROT, INR in the last 72 hours.  Urinalysis: Recent Labs    09/03/21 2014  [place]ilder 1.006   PHURINE 5.0  GLUCOSEU 50*  HGBUR SMALL*  BILIRUBINUR NEGATIVE  KETONESUR NEGATIVE  PROTEINUR 100*  NITRITE NEGATIVE  LEUKOCYTESUR NEGATIVE       Imaging: MR BRAIN WO CONTRAST  Result Date: 09/04/2021 CLINICAL DATA:  Acute neurologic deficit EXAM: MRI HEAD WITHOUT CONTRAST TECHNIQUE: Multiplanar, multiecho pulse sequences of the brain and surrounding structures were obtained without intravenous contrast. COMPARISON:  None. FINDINGS: Brain: No acute infarct, mass effect or extra-axial collection. No acute or chronic hemorrhage. Normal white matter signal, parenchymal volume and CSF spaces. The midline structures are normal. Vascular: Major flow voids are preserved. Skull and upper cervical spine: Normal calvarium and skull base. Visualized upper cervical spine and soft tissues are normal. Sinuses/Orbits:No paranasal sinus fluid levels or advanced mucosal thickening. No mastoid or middle ear effusion. Normal orbits. IMPRESSION: Normal brain MRI. Electronically Signed   By: Ulyses Jarred M.D.   On: 09/04/2021 00:47     Medications:    sodium chloride 100 mL/hr at 09/04/21 1015     amLODipine  10 mg Oral Daily   bisacodyl  10 mg Rectal QHS   heparin  5,000 Units Subcutaneous Q12H   insulin aspart  0-6 Units Subcutaneous TID WC   [START ON 09/05/2021] ketorolac  1 drop Both Eyes QID   levothyroxine  50 mcg Oral Q0600   lisinopril  20 mg Oral BID   pantoprazole (PROTONIX) IV  40 mg Intravenous Q24H   prednisoLONE acetate  1 drop Both Eyes TID   sodium chloride  1 g Oral TID WC   sorbitol, milk of mag, mineral oil, glycerin (SMOG) enema  200 mL Rectal Once   acetaminophen **OR** acetaminophen, diclofenac Sodium, lactulose, ondansetron **OR** ondansetron (ZOFRAN) IV  Assessment/ Plan:  56 y.o. male  with previous medical conditions including diabetes and hypertension, who was admitted to [facility] on 09/03/2021 for Hyponatremia [E87.1]  Hyponatremia believed likely due to hypothyroidism and  ileus.  Reports continued medication compliance.  Continued constipation and abdominal pain.  Agree with IV fluids for hydration.  Sodium currently 125 with IV fluid and fluid restriction in place.  We will continue to monitor   Diabetes mellitus.  Last A1c 7.5. Continue current regimen including Lantus monitor blood sugar.  Glucose stable   Hypertension continue home regimen with amlodipine and lisinopril.  Monitor blood pressure closely.  BP stable     LOS: 0 Deidrea Gaetz 3/2/20232:09 PM

## 2021-09-05 LAB — GLUCOSE, CAPILLARY: Glucose-Capillary: 188 mg/dL — ABNORMAL HIGH (ref 70–99)

## 2021-09-05 LAB — SODIUM
Sodium: 129 mmol/L — ABNORMAL LOW (ref 135–145)
Sodium: 130 mmol/L — ABNORMAL LOW (ref 135–145)

## 2021-09-05 MED ORDER — SODIUM CHLORIDE 1 G PO TABS
1.0000 g | ORAL_TABLET | Freq: Three times a day (TID) | ORAL | 0 refills | Status: AC
Start: 2021-09-05 — End: 2021-09-19

## 2021-09-05 NOTE — Progress Notes (Signed)
[facility]  ROUNDING NOTE   Subjective:   Alexander Kerr is a 56 year old Hispanic male, Spanish-speaking, with past medical histories including diabetes, hypertension, hyponatremia.He presents to the emergency department with abnormal labs.  He will be admitted for Hyponatremia [E87.1]  Patient is known to our practice from recent previous admission for hyponatremia.    Patient seen ambulating in room States he feels better today Formed BM this morning Continues to complain of abdominal pain  Sodium 130   Objective:  Vital signs in last 24 hours:  Temp:  [98.2 F (36.8 C)-98.5 F (36.9 C)] 98.5 F (36.9 C) (03/03 0755) Pulse Rate:  [75-80] 75 (03/03 0755) Resp:  [14-18] 15 (03/03 0755) BP: (124-166)/(55-72) 125/55 (03/03 0755) SpO2:  [98 %-100 %] 99 % (03/03 0755)  Weight change:  Filed Weights   09/03/21 2013  Weight: 61.2 kg    Intake/Output:   Intake/Output this shift:  Total I/O In: 480 [P.O.:480] Out: -   Physical Exam: General: No acute distress  Head: Normocephalic, atraumatic. Moist oral mucosal membranes  Eyes: Anicteric  Lungs:  Clear to auscultation, normal effort  Heart: S1S2 no rubs  Abdomen:  Soft, tender, bowel sounds present  Extremities: No peripheral edema  Neurologic: Awake, alert, following commands  Skin: No acute rash  Access: No access    Basic Metabolic Panel: Recent Labs  Lab 09/03/21 2014 09/04/21 0108 09/04/21 0510 09/04/21 0640 09/04/21 1004 09/04/21 1541 09/04/21 2317 09/05/21 0326 09/05/21 0708  NA 116*   < > 121*   < > 125* 128* 129* 129* 130*  K 5.4*  --  4.1  --   --   --   --   --   --   CL 84*  --  91*  --   --   --   --   --   --   CO2 24  --  24  --   --   --   --   --   --   GLUCOSE 122*  --  104*  --   --   --   --   --   --   BUN 12  --  11  --   --   --   --   --   --   CREATININE 0.61  --  0.57*  --   --   --   --   --   --   CALCIUM 8.4*  --  8.1*  --   --   --   --   --   --     < > = values in this interval not displayed.     Liver Function Tests: Recent Labs  Lab 09/03/21 2014 09/04/21 0510  AST 17 15  ALT 27 24  ALKPHOS 68 65  BILITOT 0.4 0.3  PROT 5.3* 4.9*  ALBUMIN 2.7* 2.5*    Recent Labs  Lab 09/03/21 2014  LIPASE 45    No results for input(s): AMMONIA in the last 168 hours.  CBC: Recent Labs  Lab 09/03/21 2014 09/04/21 0510  WBC 7.5 7.9  HGB 11.6* 10.7*  HCT 32.0* 29.4*  MCV 82.9 82.6  PLT 342 332     Cardiac Enzymes: No results for input(s): CKTOTAL, CKMB, CKMBINDEX, TROPONINI in the last 168 hours.  BNP: Invalid input(s): POCBNP  CBG: Recent Labs  Lab 09/04/21 0724 09/04/21 1115 09/04/21 1647 09/04/21 2316 09/05/21 0755  GLUCAP 94 88 147* 173* 188*  Microbiology: Results for orders placed or performed during the hospital encounter of 09/03/21  Resp Panel by RT-PCR (Flu A&B, Covid) Nasopharyngeal Swab     Status: None   Collection Time: 09/03/21  9:57 PM   Specimen: Nasopharyngeal Swab; Nasopharyngeal(NP) swabs in vial transport medium  Result Value Ref Range Status   SARS Coronavirus 2 by RT PCR NEGATIVE NEGATIVE Final    Comment: (NOTE) SARS-CoV-2 target nucleic acids are NOT DETECTED.  The SARS-CoV-2 RNA is generally detectable in upper respiratory specimens during the acute phase of infection. The lowest concentration of SARS-CoV-2 viral copies this assay can detect is 138 copies/mL. A negative result does not preclude SARS-Cov-2 infection and should not be used as the sole basis for treatment or other patient management decisions. A negative result may occur with  improper specimen collection/handling, submission of specimen other than nasopharyngeal swab, presence of viral mutation(s) within the areas targeted by this assay, and inadequate number of viral copies(<138 copies/mL). A negative result must be combined with clinical observations, patient history, and epidemiological information. The  expected result is Negative.  Fact Sheet for Patients:  BloggerCourse.com  Fact Sheet for Healthcare Providers:  SeriousBroker.it  This test is no t yet approved or cleared by the [place] FDA and  has been authorized for detection and/or diagnosis of SARS-CoV-2 by FDA under an Emergency Use Authorization (EUA). This EUA will remain  in effect (meaning this test can be used) for the duration of the COVID-19 declaration under Section 564(b)(1) of the Act, 21 U.S.C.section 360bbb-3(b)(1), unless the authorization is terminated  or revoked sooner.       Influenza A by PCR NEGATIVE NEGATIVE Final   Influenza B by PCR NEGATIVE NEGATIVE Final    Comment: (NOTE) The Xpert Xpress SARS-CoV-2/FLU/RSV plus assay is intended as an aid in the diagnosis of influenza from Nasopharyngeal swab specimens and should not be used as a sole basis for treatment. Nasal washings and aspirates are unacceptable for Xpert Xpress SARS-CoV-2/FLU/RSV testing.  Fact Sheet for Patients: BloggerCourse.com  Fact Sheet for Healthcare Providers: SeriousBroker.it  This test is not yet approved or cleared by the [place] FDA and has been authorized for detection and/or diagnosis of SARS-CoV-2 by FDA under an Emergency Use Authorization (EUA). This EUA will remain in effect (meaning this test can be used) for the duration of the COVID-19 declaration under Section 564(b)(1) of the Act, 21 U.S.C. section 360bbb-3(b)(1), unless the authorization is terminated or revoked.  Performed at [facility], [address] [place], [place] [address]     Coagulation Studies: No results for input(s): LABPROT, INR in the last 72 hours.  Urinalysis: Recent Labs    09/03/21 2014  COLORURINE STRAW*  LABSPEC 1.006  PHURINE 5.0  GLUCOSEU 50*  HGBUR SMALL*  BILIRUBINUR NEGATIVE  KETONESUR NEGATIVE   PROTEINUR 100*  NITRITE NEGATIVE  LEUKOCYTESUR NEGATIVE       Imaging: MR BRAIN WO CONTRAST  Result Date: 09/04/2021 CLINICAL DATA:  Acute neurologic deficit EXAM: MRI HEAD WITHOUT CONTRAST TECHNIQUE: Multiplanar, multiecho pulse sequences of the brain and surrounding structures were obtained without intravenous contrast. COMPARISON:  None. FINDINGS: Brain: No acute infarct, mass effect or extra-axial collection. No acute or chronic hemorrhage. Normal white matter signal, parenchymal volume and CSF spaces. The midline structures are normal. Vascular: Major flow voids are preserved. Skull and upper cervical spine: Normal calvarium and skull base. Visualized upper cervical spine and soft tissues are normal. Sinuses/Orbits:No paranasal sinus fluid levels or advanced  mucosal thickening. No mastoid or middle ear effusion. Normal orbits. IMPRESSION: Normal brain MRI. Electronically Signed   By: Deatra Robinson M.D.   On: 09/04/2021 00:47     Medications:       amLODipine  10 mg Oral Daily   bisacodyl  10 mg Rectal QHS   heparin  5,000 Units Subcutaneous Q12H   insulin aspart  0-6 Units Subcutaneous TID WC   ketorolac  1 drop Both Eyes QID   levothyroxine  50 mcg Oral Q0600   lisinopril  20 mg Oral BID   pantoprazole (PROTONIX) IV  40 mg Intravenous Q24H   prednisoLONE acetate  1 drop Both Eyes TID   sodium chloride  1 g Oral TID WC   sorbitol, milk of mag, mineral oil, glycerin (SMOG) enema  200 mL Rectal Once   acetaminophen **OR** acetaminophen, diclofenac Sodium, lactulose, ondansetron **OR** ondansetron (ZOFRAN) IV  Assessment/ Plan:  56 y.o. male  with previous medical conditions including diabetes and hypertension, who was admitted to [facility] on 09/03/2021 for Hyponatremia [E87.1]  Hyponatremia believed likely due to hypothyroidism and ileus.  Reports continued medication compliance.   Sodium improved to 130 with IV fluids and fluid restriction.  Patient cleared to discharge from renal  stance with sodium tablets.  Patient recommended to follow-up with primary care physician.   Diabetes mellitus.  Last A1c 7.5. Continue current regimen including Lantus monitor blood sugar.    Hypertension continue home regimen with amlodipine and lisinopril.  Monitor blood pressure closely.       LOS: 1 Jai Steil 3/3/20232:44 PM

## 2021-09-05 NOTE — Plan of Care (Signed)
Patient arrived to room 132 in stable condition. Aox4. Communicated with patient via video spanish interpreter. Reports minimal abdominal pain but refused pain medication. Denies nausea and vomiting. Plan of care reviewed with patient. Room/unit orientation completed. Call bell within reach. ? ? ? ?Problem: Education: ?Goal: Knowledge of General Education information will improve ?Description: Including pain rating scale, medication(s)/side effects and non-pharmacologic comfort measures ?Outcome: Progressing ?  ?Problem: Health Behavior/Discharge Planning: ?Goal: Ability to manage health-related needs will improve ?Outcome: Progressing ?  ?Problem: Clinical Measurements: ?Goal: Ability to maintain clinical measurements within normal limits will improve ?Outcome: Progressing ?Goal: Will remain free from infection ?Outcome: Progressing ?Goal: Diagnostic test results will improve ?Outcome: Progressing ?Goal: Respiratory complications will improve ?Outcome: Progressing ?Goal: Cardiovascular complication will be avoided ?Outcome: Progressing ?  ?Problem: Activity: ?Goal: Risk for activity intolerance will decrease ?Outcome: Progressing ?  ?Problem: Nutrition: ?Goal: Adequate nutrition will be maintained ?Outcome: Progressing ?  ?Problem: Coping: ?Goal: Level of anxiety will decrease ?Outcome: Progressing ?  ?Problem: Elimination: ?Goal: Will not experience complications related to bowel motility ?Outcome: Progressing ?Goal: Will not experience complications related to urinary retention ?Outcome: Progressing ?  ?Problem: Pain Managment: ?Goal: General experience of comfort will improve ?Outcome: Progressing ?  ?Problem: Safety: ?Goal: Ability to remain free from injury will improve ?Outcome: Progressing ?  ?Problem: Skin Integrity: ?Goal: Risk for impaired skin integrity will decrease ?Outcome: Progressing ?  ?

## 2021-09-05 NOTE — Progress Notes (Signed)
Discharge Note: ?Nursing staff reviewed discharge instructions with pt. Pt wheeled to the d/c lounge.  ?

## 2021-09-05 NOTE — Discharge Summary (Signed)
Physician Discharge Summary   Patient: Alexander Kerr MRN: [id number] DOB: 03/19/1966  Admit date:     09/03/2021  Discharge date: 09/05/21  Discharge Physician: Antonieta Pert   PCP: [facility]   Recommendations at discharge:   Check CBC BMP in 1 week and follow-up with nephrology. Needs pcp fu in 1 wk  Hospital Course: 56 y.o. 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  As per the history patient has been feeling somnolent recently saw her doctor and lab work were done and directed to come to the ED for low sodium level. Patient's somnolent in the ED awaken to voice answer questions and converses with the son speaks in Spanish no chest pain abdominal pain but having constipation and was getting lactulose which is working well.In the ED sodium 116 potassium 5.4 given IV fluids COVID-19 negative on panel negative stable B12 osmolality 246, CBC with chronic anemia hemoglobin 10.7 g, MRI brain negative.  Patient was admitted on IV fluids for further management. Seen by nephrology, kept on IV fluid hydration with serial monitoring of sodium level and has improved to 130 at this time.  Continue fluid restriction salt tablet. We will plan for discharge home once cleared by nephrology.  Discharge Diagnoses: * Hyponatremia Recent admission for similar hyponatremia.  Sodium now improved with IV fluid, fluid restriction and salt tablets.  Appreciate  nephrology input.  Advise close follow-up with nephrology Recent Labs  Lab 09/04/21 1004 09/04/21 1541 09/04/21 2317 09/05/21 0326 09/05/21 0708  NA 125* 128* 129* 129* 130*    Somnolence MRI brain negative.  Mental state stable improved communicative.  Anemia Hemoglobin stable appears chronic.  No indication to pursue further work-up here needs outpatient PCP follow-up   Recent Labs  Lab 09/03/21 2014 09/04/21 0510  HGB 11.6* 10.7*  HCT 32.0* 29.4*    Hyperkalemia Resolved Recent Labs  Lab 09/03/21 2014 09/04/21 0510  K 5.4* 4.1    Hypothyroidism contr home Synthroid.he was recently placed on thyroid supplement for abnormal thyroid function  Constipation Ongoing chronic issue for his taking lactulose.  Continue on aggressive bowel regimen/enema.   Hypertension Blood pressure at this time is stable continue home meds amlodipine lisinopril   Diabetes mellitus without complication (HCC) Last [id number] is 7.5 on 08/22/21. Patient hypoglycemic here-now blood sugar improved.  Continue diet.  Resume home meds on discharge and watch for hypoglycemia at home Recent Labs  Lab 09/04/21 0205 09/04/21 0724 09/04/21 1115 09/04/21 1647 09/04/21 2316  GLUCAP 89 94 88 147* 173*      Consultants: nephrology Procedures performed: none  Disposition: Home Diet recommendation:  Carb modified diet  DISCHARGE MEDICATION: Allergies as of 09/05/2021   No Known Allergies      Medication List     TAKE these medications    amLODipine 10 MG tablet Commonly known as: NORVASC Take 10 mg by mouth daily.   bisacodyl 10 MG suppository Commonly known as: DULCOLAX Place 10 mg rectally at bedtime.   ketorolac 0.5 %  ophthalmic solution Commonly known as: ACULAR Place 1 drop into both eyes 4 (four) times daily.   lactulose 10 GM/15ML solution Commonly known as: CHRONULAC Take 30 g by mouth daily as needed.   Lantus SoloStar 100 UNIT/ML Solostar Pen Generic drug: insulin  glargine Inject 10 Units into the skin 2 (two) times daily.   levothyroxine 50 MCG tablet Commonly known as: SYNTHROID Take 1 tablet (50 mcg total) by mouth daily before breakfast.   lisinopril 20 MG tablet Commonly known as: ZESTRIL Take 20 mg by mouth 2 (two) times daily.   omeprazole 20 MG capsule Commonly known as: PRILOSEC Take 20 mg by mouth 2 (two) times daily.   prednisoLONE acetate 1 % ophthalmic suspension Commonly known as: PRED FORTE 1 drop in the morning, at noon, and at bedtime.   sodium chloride 1 g tablet Take 1 tablet (1 g total) by mouth 3 (three) times daily with meals for 14 days.   Voltaren 1 % Gel Generic drug: diclofenac Sodium Apply 1 application topically 4 (four) times daily as needed.        [place] Follow up in 1 week(s).   Contact information: [place] [address] [phone]         Lavonia Dana, MD Follow up in 1 week(s).   Specialty: Nephrology Contact information: [address] [place] [address] [phone]                 Discharge Exam: Danley Danker Weights   09/03/21 2013  Weight: 61.2 kg  Condition at discharge: good  The results of significant diagnostics from this hospitalization (including imaging, microbiology, ancillary and laboratory) are listed below for reference.   Imaging Studies: DG Chest 2 View  Result Date: 08/22/2021 CLINICAL DATA:  Upper abdominal pain. EXAM: CHEST - 2 VIEW COMPARISON:  None. FINDINGS: The heart size and mediastinal contours are within normal limits. Both lungs are clear. The visualized skeletal structures are unremarkable. IMPRESSION: No active cardiopulmonary disease. Electronically Signed   By: Marijo Conception M.D.   On: 08/22/2021 06:44   DG Abd 1 View  Result Date: 08/25/2021 CLINICAL DATA:  Abdominal distension reported on previous imaging. EXAM: ABDOMEN - 1 VIEW COMPARISON:  August 24, 2021 FINDINGS: EKG leads project over the lower chest and abdomen. No support tubes are demonstrated. Mild gaseous distension of the stomach. Stool and gas throughout the colon to the level of the rectum. Mild colonic distension without change gaseous distension throughout with scattered areas of stool. No small bowel dilation. No abnormal calcifications. On limited assessment there is no acute skeletal finding. Soft tissues are grossly unremarkable. IMPRESSION: 1. Mild gaseous distension of the colon persists with gas to the level of the rectum. Findings could reflect a component of mild ileus. Overall findings are not substantially changed compared to previous imaging. 2. No support tubes visualized. Electronically Signed   By: Zetta Bills M.D.   On: 08/25/2021 09:50   DG Abd 1 View  Result Date: 08/24/2021 CLINICAL DATA:  Abdominal distention. EXAM: ABDOMEN - 1 VIEW COMPARISON:  CT scan 08/22/2021 FINDINGS: Diffuse gaseous distention of the colon evident. No substantial small bowel gaseous dilatation. Visualized bony anatomy unremarkable. IMPRESSION: Diffuse gaseous distention of the colon. Component of ileus not excluded. Electronically Signed   By: Misty Stanley M.D.   On: 08/24/2021 12:10   CT CHEST W CONTRAST  Result Date: 08/24/2021 CLINICAL  DATA:  Hyponatremia, evaluate for underlying lung mass EXAM: CT CHEST WITH CONTRAST TECHNIQUE: Multidetector CT imaging of the chest was performed during intravenous contrast administration. RADIATION DOSE REDUCTION: This exam was performed according to the departmental dose-optimization program which includes automated exposure control, adjustment of the mA and/or kV according to patient size and/or use of iterative reconstruction technique. CONTRAST:  85mL OMNIPAQUE IOHEXOL 300 MG/ML  SOLN COMPARISON:  None. FINDINGS: Cardiovascular: Aortic atherosclerosis. Normal heart size. Three-vessel coronary artery calcifications. No pericardial effusion.  Mediastinum/Nodes: No enlarged mediastinal, hilar, or axillary lymph nodes. Thyroid gland, trachea, and esophagus demonstrate no significant findings. Lungs/Pleura: Minimal dependent bibasilar partial atelectasis or scarring. Occasional small pulmonary nodules for example in the dependent left lung base measuring 0.4 cm (series 3, image 94) no pleural effusion or pneumothorax. Upper Abdomen: No acute abnormality. Musculoskeletal: No chest wall abnormality. No suspicious osseous lesions identified. IMPRESSION: 1. No evidence of mass or lymphadenopathy in the chest. 2. Occasional small pulmonary nodules, measuring no greater than 0.4 cm. No follow-up needed if patient is low-risk (and has no known or suspected primary neoplasm). Non-contrast chest CT can be considered in 12 months if patient is high-risk. This recommendation follows the consensus statement: Guidelines for Management of Incidental Pulmonary Nodules Detected on CT Images: From the Fleischner Society 2017; Radiology 2017; 284:228-243. 3. Coronary artery disease. Aortic Atherosclerosis (ICD10-I70.0). Electronically Signed   By: Delanna Ahmadi M.D.   On: 08/24/2021 12:40   MR BRAIN WO CONTRAST  Result Date: 09/04/2021 CLINICAL DATA:  Acute neurologic deficit EXAM: MRI HEAD WITHOUT CONTRAST TECHNIQUE: Multiplanar, multiecho pulse sequences of the brain and surrounding structures were obtained without intravenous contrast. COMPARISON:  None. FINDINGS: Brain: No acute infarct, mass effect or extra-axial collection. No acute or chronic hemorrhage. Normal white matter signal, parenchymal volume and CSF spaces. The midline structures are normal. Vascular: Major flow voids are preserved. Skull and upper cervical spine: Normal calvarium and skull base. Visualized upper cervical spine and soft tissues are normal. Sinuses/Orbits:No paranasal sinus fluid levels or advanced mucosal thickening. No mastoid or middle ear effusion. Normal orbits. IMPRESSION: Normal  brain MRI. Electronically Signed   By: Ulyses Jarred M.D.   On: 09/04/2021 00:47   MR Lumbar Spine W Wo Contrast  Result Date: 08/24/2021 CLINICAL DATA:  55 year old male with low back pain, possible infection. EXAM: MRI LUMBAR SPINE WITHOUT AND WITH CONTRAST TECHNIQUE: Multiplanar and multiecho pulse sequences of the lumbar spine were obtained without and with intravenous contrast. CONTRAST:  70mL GADAVIST GADOBUTROL 1 MMOL/ML IV SOLN COMPARISON:  CT Abdomen and Pelvis 08/22/2021. FINDINGS: Segmentation:  Normal on the comparison. Alignment:  Straightening of lumbar lordosis, otherwise normal. Vertebrae: No marrow edema or evidence of acute osseous abnormality. Visualized bone marrow signal is within normal limits. Intact visible sacrum. Conus medullaris and cauda equina: Conus extends to the T12-L1 level. No lower spinal cord or conus signal abnormality. No abnormal intradural enhancement or dural thickening. Normal cauda equina nerve roots. Paraspinal and other soft tissues: Negative. Disc levels: T11-T12: Negative. T12-L1:  Negative. L1-L2:  Negative. L2-L3:  Negative. L3-L4: Mild disc desiccation and circumferential disc bulge. No spinal or lateral recess stenosis. Borderline to mild bilateral L3 foraminal stenosis. L4-L5: Subtle left far lateral disc protrusion with annular fissure and enhancement (series 8, image 28). But otherwise negative. No spinal stenosis or foraminal involvement. L5-S1:  Subtle circumferential disc bulging. Otherwise negative. IMPRESSION: 1. No evidence of infection and largely normal for age MRI appearance of the lumbar spine. 2.  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 3. Minimal lumbar spine degeneration otherwise. Borderline to mild bilateral L3 neural foraminal stenosis. Electronically Signed   By: Genevie Ann M.D.   On: 08/24/2021 12:08   CT ABDOMEN PELVIS W CONTRAST  Result Date: 08/22/2021 CLINICAL  DATA:  56 year old male with abdominal distension. Constipation. Abdominal pain for 2 weeks. EXAM: CT ABDOMEN AND PELVIS WITH CONTRAST TECHNIQUE: Multidetector CT imaging of the abdomen and pelvis was performed using the standard protocol following bolus administration of intravenous contrast. RADIATION DOSE REDUCTION: This exam was performed according to the departmental dose-optimization program which includes automated exposure control, adjustment of the mA and/or kV according to patient size and/or use of iterative reconstruction technique. CONTRAST:  166mL OMNIPAQUE IOHEXOL 300 MG/ML  SOLN COMPARISON:  Chest radiographs 0629 hours today. FINDINGS: Lower chest: Negative.  No pericardial or pleural effusion. Hepatobiliary: Negative liver and gallbladder. Pancreas: Negative. Spleen: Negative. Adrenals/Urinary Tract: Normal adrenal glands. Kidneys appear symmetric and normal. Normal renal enhancement and contrast excretion. No nephrolithiasis or pararenal inflammation. Distended but otherwise unremarkable bladder (sagittal image 52). Stomach/Bowel: Negative rectum but retained stool throughout the upstream large bowel which is mildly redundant. No large bowel wall thickening or inflammation. Right lower quadrant appendix appears to contain oral contrast and otherwise appears normal (coronal image 42). Negative terminal ileum. No dilated small bowel. Unremarkable stomach and duodenum. No free air, free fluid, mesenteric inflammation. Vascular/Lymphatic: No lymphadenopathy identified. Aortoiliac calcified atherosclerosis. And extensive visible femoral artery calcified atherosclerosis. Major arterial structures remain patent. Portal venous system is patent. Reproductive: Negative. Other: No pelvic free fluid. There are relatively large left pelvic floor phleboliths. Musculoskeletal: Negative. Small left femoral head benign bone island. IMPRESSION: 1. No acute or inflammatory process identified in the abdomen or  pelvis. Retained stool throughout the large bowel (sparing the rectum) may reflect constipation. Normal appendix. Distended but otherwise unremarkable bladder, query urinary retention. 2. Aortic Atherosclerosis (ICD10-I70.0). Electronically Signed   By: Genevie Ann M.D.   On: 08/22/2021 07:11    Microbiology: Results for orders placed or performed during the hospital encounter of 09/03/21  Resp Panel by RT-PCR (Flu A&B, Covid) Nasopharyngeal Swab     Status: None   Collection Time: 09/03/21  9:57 PM   Specimen: Nasopharyngeal Swab; Nasopharyngeal(NP) swabs in vial transport medium  Result Value Ref Range Status   SARS Coronavirus 2 by RT PCR NEGATIVE NEGATIVE Final    Comment: (NOTE) SARS-CoV-2 target nucleic acids are NOT DETECTED.  The SARS-CoV-2 RNA is generally detectable in upper respiratory specimens during the acute phase of infection. The lowest concentration of SARS-CoV-2 viral copies this assay can detect is 138 copies/mL. A negative result does not preclude SARS-Cov-2 infection and should not be used as the sole basis for treatment or other patient management decisions. A negative result may occur with  improper specimen collection/handling, submission of specimen other than nasopharyngeal swab, presence of viral mutation(s) within the areas targeted by this assay, and inadequate number of viral copies(<138 copies/mL). A negative result must be combined with clinical observations, patient history, and epidemiological information. The expected result is Negative.  Fact Sheet for Patients:  EntrepreneurPulse.com.au  Fact Sheet for Healthcare Providers:  IncredibleEmployment.be  This test is no t yet approved or cleared by the [place] FDA and  has been authorized for detection and/or diagnosis of SARS-CoV-2 by FDA under an Emergency Use Authorization (EUA). This EUA will remain  in effect (  meaning this test can be used) for the duration  of the COVID-19 declaration under Section 564(b)(1) of the Act, 21 U.S.C.section 360bbb-3(b)(1), unless the authorization is terminated  or revoked sooner.       Influenza A by PCR NEGATIVE NEGATIVE Final   Influenza B by PCR NEGATIVE NEGATIVE Final    Comment: (NOTE) The Xpert Xpress SARS-CoV-2/FLU/RSV plus assay is intended as an aid in the diagnosis of influenza from Nasopharyngeal swab specimens and should not be used as a sole basis for treatment. Nasal washings and aspirates are unacceptable for Xpert Xpress SARS-CoV-2/FLU/RSV testing.  Fact Sheet for Patients: EntrepreneurPulse.com.au  Fact Sheet for Healthcare Providers: IncredibleEmployment.be  This test is not yet approved or cleared by the [place] FDA and has been authorized for detection and/or diagnosis of SARS-CoV-2 by FDA under an Emergency Use Authorization (EUA). This EUA will remain in effect (meaning this test can be used) for the duration of the COVID-19 declaration under Section 564(b)(1) of the Act, 21 U.S.C. section 360bbb-3(b)(1), unless the authorization is terminated or revoked.  Performed at [facility], [address] [place], [place] [address]     Labs: CBC: Recent Labs  Lab 09/03/21 2014 09/04/21 0510  WBC 7.5 7.9  HGB 11.6* 10.7*  HCT 32.0* 29.4*  MCV 82.9 82.6  PLT 342 [id number]   Basic Metabolic Panel: Recent Labs  Lab 09/03/21 2014 09/04/21 0108 09/04/21 0510 09/04/21 0640 09/04/21 1004 09/04/21 1541 09/04/21 2317 09/05/21 0326 09/05/21 0708  NA 116*   < > 121*   < > 125* 128* 129* 129* 130*  K 5.4*  --  4.1  --   --   --   --   --   --   CL 84*  --  91*  --   --   --   --   --   --   CO2 24  --  24  --   --   --   --   --   --   GLUCOSE 122*  --  104*  --   --   --   --   --   --   BUN 12  --  11  --   --   --   --   --   --   CREATININE 0.61  --  0.57*  --   --   --   --   --   --   CALCIUM 8.4*  --  8.1*  --   --   --    --   --   --    < > = values in this interval not displayed.   Liver Function Tests: Recent Labs  Lab 09/03/21 2014 09/04/21 0510  AST 17 15  ALT 27 24  ALKPHOS 68 65  BILITOT 0.4 0.3  PROT 5.3* 4.9*  ALBUMIN 2.7* 2.5*   CBG: Recent Labs  Lab 09/04/21 0724 09/04/21 1115 09/04/21 1647 09/04/21 2316 09/05/21 0755  GLUCAP 94 88 147* 173* 188*    Discharge time spent: 25 minutes.  Signed: Antonieta Pert, MD [facility] 09/05/2021

## 2021-09-05 NOTE — Progress Notes (Signed)
Notified pt was being placed in D/C lounge. Notified volunteer services of patient being moved there. ?
